# Patient Record
Sex: Female | Born: 2000 | Race: White | Hispanic: No | Smoking: Never smoker
Health system: Southern US, Community
[De-identification: ages and names within clinical notes are randomized; demographics above are authoritative.]

## PROBLEM LIST (undated history)

## (undated) DIAGNOSIS — F329 Major depressive disorder, single episode, unspecified: Secondary | ICD-10-CM

## (undated) DIAGNOSIS — F319 Bipolar disorder, unspecified: Secondary | ICD-10-CM

## (undated) DIAGNOSIS — J45909 Unspecified asthma, uncomplicated: Secondary | ICD-10-CM

## (undated) DIAGNOSIS — F32A Depression, unspecified: Secondary | ICD-10-CM

---
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
## 2018-01-07 ENCOUNTER — Encounter (HOSPITAL_COMMUNITY): Payer: Self-pay | Admitting: *Deleted

## 2018-01-07 ENCOUNTER — Other Ambulatory Visit: Payer: Self-pay

## 2018-01-07 ENCOUNTER — Inpatient Hospital Stay (HOSPITAL_COMMUNITY)
Admission: RE | Admit: 2018-01-07 | Discharge: 2018-01-10 | DRG: 885 | Disposition: A | Discharge status: Home / Self Care | Payer: Medicaid Other | Attending: Psychiatry | Admitting: Psychiatry

## 2018-01-07 DIAGNOSIS — F4310 Post-traumatic stress disorder, unspecified: Secondary | ICD-10-CM | POA: Diagnosis present

## 2018-01-07 DIAGNOSIS — F419 Anxiety disorder, unspecified: Secondary | ICD-10-CM | POA: Diagnosis not present

## 2018-01-07 DIAGNOSIS — Z818 Family history of other mental and behavioral disorders: Secondary | ICD-10-CM | POA: Diagnosis not present

## 2018-01-07 DIAGNOSIS — F909 Attention-deficit hyperactivity disorder, unspecified type: Secondary | ICD-10-CM | POA: Diagnosis present

## 2018-01-07 DIAGNOSIS — Z793 Long term (current) use of hormonal contraceptives: Secondary | ICD-10-CM

## 2018-01-07 DIAGNOSIS — Z62810 Personal history of physical and sexual abuse in childhood: Secondary | ICD-10-CM | POA: Diagnosis present

## 2018-01-07 DIAGNOSIS — F332 Major depressive disorder, recurrent severe without psychotic features: Secondary | ICD-10-CM | POA: Diagnosis present

## 2018-01-07 DIAGNOSIS — Z915 Personal history of self-harm: Secondary | ICD-10-CM

## 2018-01-07 DIAGNOSIS — N390 Urinary tract infection, site not specified: Secondary | ICD-10-CM | POA: Diagnosis present

## 2018-01-07 DIAGNOSIS — Z23 Encounter for immunization: Secondary | ICD-10-CM | POA: Diagnosis not present

## 2018-01-07 DIAGNOSIS — Z87440 Personal history of urinary (tract) infections: Secondary | ICD-10-CM

## 2018-01-07 DIAGNOSIS — J45909 Unspecified asthma, uncomplicated: Secondary | ICD-10-CM | POA: Diagnosis present

## 2018-01-07 DIAGNOSIS — R45851 Suicidal ideations: Secondary | ICD-10-CM | POA: Diagnosis present

## 2018-01-07 DIAGNOSIS — G4700 Insomnia, unspecified: Secondary | ICD-10-CM | POA: Diagnosis present

## 2018-01-07 DIAGNOSIS — Z79899 Other long term (current) drug therapy: Secondary | ICD-10-CM

## 2018-01-07 HISTORY — DX: Depression, unspecified: F32.A

## 2018-01-07 HISTORY — DX: Major depressive disorder, single episode, unspecified: F32.9

## 2018-01-07 HISTORY — DX: Unspecified asthma, uncomplicated: J45.909

## 2018-01-07 MED ORDER — HYDROXYZINE HCL 25 MG PO TABS
25.0000 mg | ORAL_TABLET | Freq: Three times a day (TID) | ORAL | Status: DC | PRN
  Administered 2018-01-08 – 2018-01-09 (×4): 25 mg via ORAL
  Filled 2018-01-07 (×4): qty 1

## 2018-01-07 MED ORDER — ACETAMINOPHEN 325 MG PO TABS
650.0000 mg | ORAL_TABLET | Freq: Four times a day (QID) | ORAL | Status: DC | PRN
  Administered 2018-01-08 – 2018-01-09 (×3): 650 mg via ORAL
  Filled 2018-01-07 (×3): qty 2

## 2018-01-07 MED ORDER — INFLUENZA VAC SPLIT QUAD 0.5 ML IM SUSY
0.5000 mL | PREFILLED_SYRINGE | INTRAMUSCULAR | Status: AC
  Administered 2018-01-08: 0.5 mL via INTRAMUSCULAR
  Filled 2018-01-07: qty 0.5

## 2018-01-07 MED ORDER — TRAZODONE HCL 50 MG PO TABS
50.0000 mg | ORAL_TABLET | Freq: Every evening | ORAL | Status: DC | PRN
  Administered 2018-01-07 – 2018-01-09 (×3): 50 mg via ORAL
  Filled 2018-01-07 (×3): qty 1

## 2018-01-07 MED ORDER — ALUM & MAG HYDROXIDE-SIMETH 200-200-20 MG/5ML PO SUSP: 30.0000 mL | ORAL | Status: DC | PRN

## 2018-01-07 MED ORDER — MAGNESIUM HYDROXIDE 400 MG/5ML PO SUSP: 30.0000 mL | Freq: Every day | ORAL | Status: DC | PRN

## 2018-01-07 NOTE — Tx Team (Signed)
Initial Treatment Plan 01/07/2018 10:02 PM [name] Handy GNF:[id number]    PATIENT STRESSORS: Marital or family conflict Medication change or noncompliance   PATIENT STRENGTHS: Ability for insight Average or above average intelligence Capable of independent living Communication skills General fund of knowledge Motivation for treatment/growth Physical Health Supportive family/friends   PATIENT IDENTIFIED PROBLEMS: Depression Suicidal thoughts "I want to not have the urge to end it all"                     DISCHARGE CRITERIA:  Ability to meet basic life and health needs Improved stabilization in mood, thinking, and/or behavior Reduction of life-threatening or endangering symptoms to within safe limits Verbal commitment to aftercare and medication compliance  PRELIMINARY DISCHARGE PLAN: Attend aftercare/continuing care group Return to previous living arrangement  PATIENT/FAMILY INVOLVEMENT: This treatment plan has been presented to and reviewed with the patient, Deborah Hubbard, and/or family member, .  The patient and family have been given the opportunity to ask questions and make suggestions.  [name], [place], [place] 01/07/2018, 10:02 PM

## 2018-01-07 NOTE — H&P (Signed)
Behavioral Health Medical Screening Exam  Deborah Hubbard Deborah Hubbard is an 17 y.o. female.  Total Time spent with patient: 20 minutes  Psychiatric Specialty Exam: Physical Exam  Constitutional: She is oriented to person, place, and time. She appears well-developed and well-nourished. No distress.  HENT:  Head: Normocephalic and atraumatic.  Right Ear: External ear normal.  Left Ear: External ear normal.  Eyes: Conjunctivae are normal. Right eye exhibits no discharge. Left eye exhibits no discharge.  Respiratory: Effort normal. No respiratory distress.  Musculoskeletal: Normal range of motion.  Neurological: She is alert and oriented to person, place, and time.  Skin: Skin is warm and dry. She is not diaphoretic.  Psychiatric: Her speech is normal. Her mood appears anxious. She is not withdrawn and not actively hallucinating. Thought content is not paranoid and not delusional. She expresses impulsivity and inappropriate judgment. She exhibits a depressed mood. She expresses suicidal ideation. She expresses no homicidal ideation. She expresses suicidal plans.    Review of Systems  Constitutional: Negative for chills, fever and weight loss.  Psychiatric/Behavioral: Positive for depression and suicidal ideas. Negative for hallucinations, memory loss and substance abuse. The patient is nervous/anxious and has insomnia.     There were no vitals taken for this visit.There is no height or weight on file to calculate BMI.  General Appearance: Casual and Fairly Groomed  Eye Contact:  Fair  Speech:  Clear and Coherent and Normal Rate  Volume:  Decreased  Mood:  Anxious, Depressed, Dysphoric, Hopeless and Worthless  Affect:  Congruent and Depressed  Thought Process:  Coherent, Goal Directed and Descriptions of Associations: Intact  Orientation:  Full (Time, Place, and Person)  Thought Content:  Logical and Hallucinations: None  Suicidal Thoughts:  Yes.  with intent/plan  Homicidal Thoughts:  No   Memory:  Immediate;   Fair Recent;   Fair  Judgement:  Impaired  Insight:  Fair  Psychomotor Activity:  Normal  Concentration: Concentration: Fair and Attention Span: Fair  Recall:  Good  Fund of Knowledge:Good  Language: Good  Akathisia:  No  Handed:  Right  AIMS (if indicated):     Assets:  Communication Skills Desire for Improvement Housing Intimacy Leisure Time Physical Health Transportation  Sleep:       Musculoskeletal: Strength & Muscle Tone: within normal limits Gait & Station: normal    Recommendations:  Based on my evaluation the patient does not appear to have an emergency medical condition.  Jackelyn Poling, NP 01/07/2018, 9:03 PM
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
## 2018-01-07 NOTE — Progress Notes (Signed)
Writer assume care for Pt. Pt denies SI/HI/AVH/Pain at this time. Snacks and fluids given. PRN trazodone requested and given. Will contiune with POC.

## 2018-01-07 NOTE — BH Assessment (Cosign Needed)
Disposition: Nira Conn, NP, patient meets inpatient criteria. Per Hassie Bruce, patient accepted to [facility]. [facility] Adult Bed 403-2.  Nira Conn - Accepting.  Dr. Jama Flavors - Attending

## 2018-01-08 DIAGNOSIS — F419 Anxiety disorder, unspecified: Secondary | ICD-10-CM

## 2018-01-08 DIAGNOSIS — F332 Major depressive disorder, recurrent severe without psychotic features: Principal | ICD-10-CM

## 2018-01-08 DIAGNOSIS — R45851 Suicidal ideations: Secondary | ICD-10-CM

## 2018-01-08 LAB — COMPREHENSIVE METABOLIC PANEL
ALK PHOS: 40 U/L (ref 38–126)
ALT: 19 U/L (ref 0–44)
ANION GAP: 8 (ref 5–15)
AST: 18 U/L (ref 15–41)
Albumin: 3.4 g/dL — ABNORMAL LOW (ref 3.5–5.0)
BILIRUBIN TOTAL: 0.4 mg/dL (ref 0.3–1.2)
BUN: 11 mg/dL (ref 6–20)
CO2: 23 mmol/L (ref 22–32)
Calcium: 8.9 mg/dL (ref 8.9–10.3)
Chloride: 110 mmol/L (ref 98–111)
Creatinine, Ser: 0.6 mg/dL (ref 0.44–1.00)
Glucose, Bld: 77 mg/dL (ref 70–99)
Potassium: 4 mmol/L (ref 3.5–5.1)
SODIUM: 141 mmol/L (ref 135–145)
TOTAL PROTEIN: 6.5 g/dL (ref 6.5–8.1)

## 2018-01-08 LAB — URINALYSIS, COMPLETE (UACMP) WITH MICROSCOPIC
Bilirubin Urine: NEGATIVE
GLUCOSE, UA: NEGATIVE mg/dL
Hgb urine dipstick: NEGATIVE
Ketones, ur: NEGATIVE mg/dL
NITRITE: NEGATIVE
PH: 6 (ref 5.0–8.0)
Protein, ur: 30 mg/dL — AB
SPECIFIC GRAVITY, URINE: 1.03 (ref 1.005–1.030)

## 2018-01-08 LAB — LIPID PANEL
Cholesterol: 129 mg/dL (ref 0–200)
HDL: 40 mg/dL — ABNORMAL LOW (ref >40)
LDL Cholesterol: 68 mg/dL (ref 0–99)
Total CHOL/HDL Ratio: 3.2 RATIO
Triglycerides: 103 mg/dL (ref <150)
VLDL: 21 mg/dL (ref 0–40)

## 2018-01-08 LAB — LITHIUM LEVEL: Lithium Lvl: <0.06 mmol/L — ABNORMAL LOW (ref 0.60–1.20)

## 2018-01-08 LAB — TSH: TSH: 2.12 u[IU]/mL (ref 0.350–4.500)

## 2018-01-08 LAB — RAPID URINE DRUG SCREEN, HOSP PERFORMED
AMPHETAMINES: NOT DETECTED
BARBITURATES: NOT DETECTED
Benzodiazepines: NOT DETECTED
Cocaine: NOT DETECTED
OPIATES: NOT DETECTED
TETRAHYDROCANNABINOL: NOT DETECTED

## 2018-01-08 LAB — PREGNANCY, URINE: Preg Test, Ur: NEGATIVE

## 2018-01-08 LAB — CBC
HCT: 40.8 % (ref 36.0–46.0)
Hemoglobin: 13.7 g/dL (ref 12.0–15.0)
MCH: 29.8 pg (ref 26.0–34.0)
MCHC: 33.6 g/dL (ref 30.0–36.0)
MCV: 88.9 fL (ref 80.0–100.0)
Platelets: 318 10*3/uL (ref 150–400)
RBC: 4.59 MIL/uL (ref 3.87–5.11)
RDW: 14.5 % (ref 11.5–15.5)
WBC: 10 10*3/uL (ref 4.0–10.5)
nRBC: 0 % (ref 0.0–0.2)

## 2018-01-08 LAB — HEMOGLOBIN A1C
HEMOGLOBIN A1C: 4.5 % — AB (ref 4.8–5.6)
Mean Plasma Glucose: 82.45 mg/dL

## 2018-01-08 MED ORDER — SERTRALINE HCL 50 MG PO TABS
50.0000 mg | ORAL_TABLET | Freq: Every day | ORAL | Status: DC
  Administered 2018-01-09 – 2018-01-10 (×2): 50 mg via ORAL
  Filled 2018-01-08 (×4): qty 1

## 2018-01-08 MED ORDER — NORELGESTROMIN-ETH ESTRADIOL 150-35 MCG/24HR TD PTWK
1.0000 | MEDICATED_PATCH | TRANSDERMAL | Status: DC
  Administered 2018-01-09: 1 via TRANSDERMAL
  Filled 2018-01-08: qty 1

## 2018-01-08 MED ORDER — ARIPIPRAZOLE 2 MG PO TABS
2.0000 mg | ORAL_TABLET | Freq: Every day | ORAL | Status: DC
  Administered 2018-01-09 – 2018-01-10 (×2): 2 mg via ORAL
  Filled 2018-01-08 (×4): qty 1

## 2018-01-08 NOTE — Progress Notes (Signed)
Pt was observed in the dayroom, seen interacting with peers. Pt attended wrap-up group. Pt appears animated/anxious in affect and mood. Pt denies SI/HI/AVH/Pain at this time. Pt appears to be somatically focused at times. PRN vistaril and trazodone requested and given. Pt was inform to remind family to bring birth control patch from home.Support and encouragement provided. Will continue with POC.

## 2018-01-08 NOTE — Progress Notes (Signed)
D:  Patient's self inventory sheet, patient has fair sleep, sleep medication helpful.  Fair appetite, low energy level, good concentration.  Rated depression 5, hopeless 3, anxiety 6.  Denied withdrawals.  Denied SI, then checked "almost all of the time".  Denied physical problems.  Denied physical pain.  Goal is hopefully work on meds, get in a better state of mind, be more open about how she feels.  Plans to talk to who she needs.  "Just in general, I apologize for asking too many questions."  No discharge plans. A:  Medications administered per MD orders.  Emotional support and encouragement given patient. R:  Denied SI and HI, contracts for safety.  Denied A/V hallucinations.  Safety maintained with 15 minute checks.
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
## 2018-01-08 NOTE — BHH Group Notes (Signed)
[facility] Mental Health Association Group Therapy 01/08/2018 1:15pm  Type of Therapy: Mental Health Association Presentation  Participation Level: Active  Participation Quality: Attentive  Affect: Appropriate  Cognitive: Oriented  Insight: Developing/Improving  Engagement in Therapy: Engaged  Modes of Intervention: Discussion, Education and Socialization  Summary of Progress/Problems: Mental Health Association (MHA) Speaker came to talk about his personal journey with mental health. The pt processed ways by which to relate to the speaker. MHA speaker provided handouts and educational information pertaining to groups and services offered by the [facility]. Pt was engaged in speaker's presentation and was receptive to resources provided.    Rona Ravens, LCSW 01/08/2018 11:02 AM

## 2018-01-08 NOTE — Therapy (Signed)
Occupational Therapy Group Note  Date:  01/08/2018 Time:  3:04 PM  Group Topic/Focus:  Self Esteem Action Plan:   The focus of this group is to help patients create a plan to continue to build self-esteem after discharge.  Participation Level:  Active  Participation Quality:  Appropriate  Affect:  Appropriate  Cognitive:  Appropriate  Insight: Improving  Engagement in Group:  Engaged  Modes of Intervention:  Activity, Discussion, Education and Socialization  Additional Comments:    S: "I just keep thinking negative but I am trying hard not to"  O: OT tx with focus on self esteem building this date. Education given on definition of self esteem, with both causes of low and high self esteem identified. Activity given for pt to identify a positive/aspiring trait for each letter of the alphabet. Pt to work with peers to help complete activity and build positive thinking.   A: Pt presents to group with appropriate affect, engaged and participatory throughout session. Pt contributed to brainstorming significantly with many examples. A-z activity completed with min-mod VC's. Pt appropriately brainstorming with other peers for support. Noted improved affect after completion of activity.   P: Education given on self esteem and how to improve this date. Handouts and activities given to help facilitate skills when reintegrating into community.   Dalphine Handing, MSOT, OTR/L Behavioral Health OT/ Acute Relief OT  Dalphine Handing 01/08/2018, 3:04 PM
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
## 2018-01-08 NOTE — Progress Notes (Signed)
Recreation Therapy Notes  Date: 10.16.19 Time: 0930 Location: [address] Dayroom  Group Topic: Stress Management  Goal Area(s) Addresses:  Patient will verbalize importance of using healthy stress management.  Patient will identify positive emotions associated with healthy stress management.   Intervention: Stress Management  Activity :  Meditation.  LRT introduced the stress management technique of meditation.  LRT played Hubbard meditation that dealt with resilience.  Patients were to listen and follow along as meditation played.  Education:  Stress Management, Discharge Planning.   Education Outcome: Acknowledges edcuation/In group clarification offered/Needs additional education  Clinical Observations/Feedback: Pt did not attend group.    Deborah Hubbard, LRT/CTRS         Deborah Hubbard 01/08/2018 11:32 AM 

## 2018-01-08 NOTE — Plan of Care (Signed)
Nurse discussed anxiety, depression, coping skills with patient. 

## 2018-01-08 NOTE — Tx Team (Addendum)
Interdisciplinary Treatment and Diagnostic Plan Update  01/09/2018 Time of Session: [place] MRN: [id number]  Principal Diagnosis: <principal problem not specified>  Secondary Diagnoses: Active Problems:   Severe recurrent major depression without psychotic features (HCC)   Current Medications:  Current Facility-Administered Medications  Medication Dose Route Frequency Provider Last Rate Last Dose  . acetaminophen (TYLENOL) tablet 650 mg  650 mg Oral Q6H PRN Lindon Romp A, NP   650 mg at 01/08/18 1445  . alum & mag hydroxide-simeth (MAALOX/MYLANTA) 200-200-20 MG/5ML suspension 30 mL  30 mL Oral Q4H PRN Lindon Romp A, NP      . ARIPiprazole (ABILIFY) tablet 2 mg  2 mg Oral Daily Cobos, Fernando A, MD      . hydrOXYzine (ATARAX/VISTARIL) tablet 25 mg  25 mg Oral TID PRN Rozetta Nunnery, NP   25 mg at 01/08/18 2110  . magnesium hydroxide (MILK OF MAGNESIA) suspension 30 mL  30 mL Oral Daily PRN Lindon Romp A, NP      . norelgestromin-ethinyl estradiol (ORTHO EVRA) 150-35 MCG/24HR transdermal patch 1 patch  1 patch Transdermal Weekly Hampton Abbot, MD      . sertraline (ZOLOFT) tablet 50 mg  50 mg Oral Daily Cobos, Fernando A, MD      . traZODone (DESYREL) tablet 50 mg  50 mg Oral QHS PRN Rozetta Nunnery, NP   50 mg at 01/08/18 2110   PTA Medications: Medications Prior to Admission  Medication Sig Dispense Refill Last Dose  . lithium carbonate 300 MG capsule Take 300 mg by mouth 2 (two) times daily with a meal.   Past Week at Unknown time  . norelgestromin-ethinyl estradiol (ORTHO EVRA) 150-35 MCG/24HR transdermal patch Place 1 patch onto the skin once a week.   01/01/2018 at Unknown time  . ondansetron (ZOFRAN-ODT) 4 MG disintegrating tablet Take 4 mg by mouth every 6 (six) hours as needed for nausea or vomiting.   Past Week at Unknown time    Patient Stressors: Marital or family conflict Medication change or noncompliance  Patient Strengths: Ability for  insight Average or above average intelligence Capable of independent living Communication skills General fund of knowledge Motivation for treatment/growth Physical Health Supportive family/friends  Treatment Modalities: Medication Management, Group therapy, Case management,  1 to 1 session with clinician, Psychoeducation, Recreational therapy.   Physician Treatment Plan for Primary Diagnosis: <principal problem not specified> Long Term Goal(s): Improvement in symptoms so as ready for discharge Improvement in symptoms so as ready for discharge   Short Term Goals: Ability to identify changes in lifestyle to reduce recurrence of condition will improve Ability to maintain clinical measurements within normal limits will improve Ability to identify changes in lifestyle to reduce recurrence of condition will improve Ability to maintain clinical measurements within normal limits will improve  Medication Management: Evaluate patient's response, side effects, and tolerance of medication regimen.  Therapeutic Interventions: 1 to 1 sessions, Unit Group sessions and Medication administration.  Evaluation of Outcomes: Not Met  Physician Treatment Plan for Secondary Diagnosis: Active Problems:   Severe recurrent major depression without psychotic features ([place])  Long Term Goal(s): Improvement in symptoms so as ready for discharge Improvement in symptoms so as ready for discharge   Short Term Goals: Ability to identify changes in lifestyle to reduce recurrence of condition will improve Ability to maintain clinical measurements within normal limits will improve Ability to identify changes in lifestyle to reduce recurrence of condition will improve Ability to maintain clinical measurements  within normal limits will improve     Medication Management: Evaluate patient's response, side effects, and tolerance of medication regimen.  Therapeutic Interventions: 1 to 1 sessions, Unit Group sessions  and Medication administration.  Evaluation of Outcomes: Not Met   RN Treatment Plan for Primary Diagnosis: <principal problem not specified> Long Term Goal(s): Knowledge of disease and therapeutic regimen to maintain health will improve  Short Term Goals: Ability to identify and develop effective coping behaviors will improve and Compliance with prescribed medications will improve  Medication Management: RN will administer medications as ordered by provider, will assess and evaluate patient's response and provide education to patient for prescribed medication. RN will report any adverse and/or side effects to prescribing provider.  Therapeutic Interventions: 1 on 1 counseling sessions, Psychoeducation, Medication administration, Evaluate responses to treatment, Monitor vital signs and CBGs as ordered, Perform/monitor CIWA, COWS, AIMS and Fall Risk screenings as ordered, Perform wound care treatments as ordered.  Evaluation of Outcomes: Not Met   LCSW Treatment Plan for Primary Diagnosis: <principal problem not specified> Long Term Goal(s): Safe transition to appropriate next level of care at discharge, Engage patient in therapeutic group addressing interpersonal concerns.  Short Term Goals: Engage patient in aftercare planning with referrals and resources, Increase social support and Increase skills for wellness and recovery  Therapeutic Interventions: Assess for all discharge needs, 1 to 1 time with Social worker, Explore available resources and support systems, Assess for adequacy in community support network, Educate family and significant other(s) on suicide prevention, Complete Psychosocial Assessment, Interpersonal group therapy.  Evaluation of Outcomes: Not Met   Progress in Treatment: Attending groups: Yes. Participating in groups: Yes. Taking medication as prescribed: Yes. Toleration medication: Yes. Family/Significant other contact made: No, will contact:  friend Patient  understands diagnosis: Yes. Discussing patient identified problems/goals with staff: Yes. Medical problems stabilized or resolved: Yes. Denies suicidal/homicidal ideation: Yes. Issues/concerns per patient self-inventory: No. Other: none  New problem(s) identified: No, Describe:  none  New Short Term/Long Term Goal(s):  Patient Goals:  "be on meds that can help me get into a positive mindset"  Discharge Plan or Barriers:   Reason for Continuation of Hospitalization: Depression Medication stabilization  Estimated Length of Stay: 3-5 days  Attendees: Patient:Deborah Hubbard 01/08/2018   Physician: Larene Beach, MD 01/08/2018   Nursing: Grayland Ormond, RN 01/08/2018   RN Care Manager: 01/08/2018   Social Worker: Lurline Idol, LCSW 01/08/2018   Recreational Therapist:  01/08/2018   Other:  01/08/2018   Other:  01/08/2018   Other: 01/08/2018        Scribe for Treatment Team: Joanne Chars, [place] 01/09/2018 8:05 AM

## 2018-01-09 LAB — PROLACTIN: PROLACTIN: 75.1 ng/mL — AB (ref 4.8–23.3)

## 2018-01-09 MED ORDER — NON FORMULARY: 1.0000 | Status: DC
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
## 2018-01-09 NOTE — Progress Notes (Signed)
Pt was observed in the dayroom, seen coloring.Pt attended wrap-up group and participated.Pt appears animated/anxious in affect and mood. Pt denies SI/HI/AVH/Pain at this time.Pt states she feels better and hopes to be discharge soon.Support and encouragement provided.PRN vistaril and trazodone requested and given. Will continue with POC.

## 2018-01-09 NOTE — Progress Notes (Signed)
Patient ID: Deborah Hubbard, female   DOB: 2001-02-05, 17 y.o.   MRN: [id number]  Nursing Progress Note [id number]  Data: Patient presents with euthymic mood and is pleasant with Clinical research associate. Patient compliant with scheduled medications. Patient denies pain/physical complaints. Patient completed self-inventory sheet and rates depression, hopelessness, and anxiety 0,0,1 respectively. Patient rates their sleep and appetite as fair/fair respectively. Patient states goal for today is to "keep a positive mind frame". Patient is seen attending groups and visible in the milieu. Patient currently denies SI/HI/AVH.   Action: Patient is educated about and provided medication per provider's orders. Patient safety maintained with q15 min safety checks and frequent rounding. Low fall risk precautions in place. Emotional support given. 1:1 interaction and active listening provided. Patient encouraged to attend meals, groups, and work on treatment plan and goals. Labs, vital signs and patient behavior monitored throughout shift.   Response: Patient remains safe on the unit at this time and agrees to come to staff with any issues/concerns. Patient is interacting with peers appropriately on the unit. Will continue to support and monitor.

## 2018-01-09 NOTE — [facility] Suicide Risk Assessment (Signed)
[facility] INPATIENT:  Family/Significant Other Suicide Prevention Education  Suicide Prevention Education:  Education Completed; [name], friend, [phone], has been identified by the patient as the family member/significant other with whom the patient will be residing, and identified as the person(s) who will aid the patient in the event of a mental health crisis (suicidal ideations/suicide attempt).  With written consent from the patient, the family member/significant other has been provided the following suicide prevention education, prior to the and/or following the discharge of the patient.  The suicide prevention education provided includes the following:  Suicide risk factors  Suicide prevention and interventions  National Suicide Hotline telephone number  [facility] assessment telephone number  [facility] Emergency Assistance 911  [facility] and/or Residential Mobile Crisis Unit telephone number  Request made of family/significant other to:  Remove weapons (e.g., guns, rifles, knives), all items previously/currently identified as safety concern.  Two rifles in the home, "no pins or ammunition".  CSW talked with her about locking up the guns.  Remove drugs/medications (over-the-counter, prescriptions, illicit drugs), all items previously/currently identified as a safety concern.  The family member/significant other verbalizes understanding of the suicide prevention education information provided.  The family member/significant other agrees to remove the items of safety concern listed above.  Lynden Ang lives next door to pt, "we check on her all the time."  Lynden Ang has been in touch with [facility] and pt also has another therapist: Jolene Provost who is going to meet with her outside of [facility].  Lynden Ang also requesting a letter for work prior to discharge so pt does not lose her job.  Lorri Frederick, LCSW 01/09/2018, 10:47 AM

## 2018-01-09 NOTE — [facility] Group Notes (Signed)
[facility] LCSW Group Therapy Note  Date/Time: 01/09/18, 1315  Type of Therapy/Topic:  Group Therapy:  Balance in Life  Participation Level:  active  Description of Group:    This group will address the concept of balance and how it feels and looks when one is unbalanced. Patients will be encouraged to process areas in their lives that are out of balance, and identify reasons for remaining unbalanced. Facilitators will guide patients utilizing problem- solving interventions to address and correct the stressor making their life unbalanced. Understanding and applying boundaries will be explored and addressed for obtaining  and maintaining a balanced life. Patients will be encouraged to explore ways to assertively make their unbalanced needs known to significant others in their lives, using other group members and facilitator for support and feedback.  Therapeutic Goals: 1. Patient will identify two or more emotions or situations they have that consume much of in their lives. 2. Patient will identify signs/triggers that life has become out of balance:  3. Patient will identify two ways to set boundaries in order to achieve balance in their lives:  4. Patient will demonstrate ability to communicate their needs through discussion and/or role plays  Summary of Patient Progress: Pt shared that issues with her son's father are consuming much of her life.  Pt very active during group discussion about setting boundaries to help keep life more in balance.           Therapeutic Modalities:   Cognitive Behavioral Therapy Solution-Focused Therapy Assertiveness Training  Daleen Squibb, [place]

## 2018-01-09 NOTE — Progress Notes (Addendum)
[facility] MD Progress Note  01/09/2018 2:55 PM Deborah Hubbard  MRN:  [id number]   Subjective: Patient states today that she is doing much much better.  She states that she never intended to hurt herself and she had the thought to cut her wrist but only superficial.  She stated that she went to go speak to someone so that she can get some help before things got worse and she was instructed to come to the hospital and then she got admitted.  Patient also states that she has a history of cutting, but that was approximately 5 years ago and this was not thinking about killing herself this was having the thought of just cutting as she used to.  Patient states that she never wants to hurt herself and wants to be there for her children and wants to go back home.  She states that she has a lot of support that surrounds her, which include family and friends that live in neighboring houses.  She still denies any suicidal or homicidal ideations.  She denies any hallucinations today.  She reports that she feels the medications are working well for her.  She states that she is sleeping well and has a good appetite.  She denies any medication side effects and is hoping to be discharged within the next day or so.  Objective: Patient's chart and findings reviewed and discussed with treatment team.  Patient presents in the day room attending group and she is pleasant, calm, and cooperative.  She has been seen interacting with peers and staff on the unit appropriately.  There have been no complaints about the patient at this time.  She has been compliant with treatment and medications which include attended her going to groups multiple times.  She plans to follow up with her outpatient provider for continued treatment.  Also did note reviewed patient's labs and her prolactin was elevated at 75.1 and after questioning patient due to her having a recent labor and may 2019 and the fact that she states that she is still producing  breast milk and does have some leakage we will continue to just monitor the prolactin will have another prolactin drawn in the morning.  Principal Problem: Severe recurrent major depression without psychotic features ([place]) Diagnosis:   Patient Active Problem List   Diagnosis Date Noted  . Severe recurrent major depression without psychotic features ([place]) [F33.2] 01/07/2018   Total Time spent with patient: 20 minutes  Past Psychiatric History: See H&P  Past Medical History:  Past Medical History:  Diagnosis Date  . Asthma   . Depression    History reviewed. No pertinent surgical history. Family History: History reviewed. No pertinent family history. Family Psychiatric  History: See H&P Social History:  Social History   Substance and Sexual Activity  Alcohol Use Never  . Frequency: Never     Social History   Substance and Sexual Activity  Drug Use Never    Social History   Socioeconomic History  . Marital status: Legally Separated    Spouse name: Not on file  . Number of children: Not on file  . Years of education: Not on file  . Highest education level: Not on file  Occupational History  . Not on file  Social Needs  . Financial resource strain: Not on file  . Food insecurity:    Worry: Not on file    Inability: Not on file  . Transportation needs:    Medical: Not on  file    Non-medical: Not on file  Tobacco Use  . Smoking status: Never Smoker  . Smokeless tobacco: Never Used  Substance and Sexual Activity  . Alcohol use: Never    Frequency: Never  . Drug use: Never  . Sexual activity: Yes    Birth control/protection: Patch  Lifestyle  . Physical activity:    Days per week: Not on file    Minutes per session: Not on file  . Stress: Not on file  Relationships  . Social connections:    Talks on phone: Not on file    Gets together: Not on file    Attends religious service: Not on file    Active member of club or organization: Not on file    Attends  meetings of clubs or organizations: Not on file    Relationship status: Not on file  Other Topics Concern  . Not on file  Social History Narrative  . Not on file   Additional Social History:                         Sleep: Good  Appetite:  Good  Current Medications: Current Facility-Administered Medications  Medication Dose Route Frequency Provider Last Rate Last Dose  . acetaminophen (TYLENOL) tablet 650 mg  650 mg Oral Q6H PRN Lindon Romp A, NP   650 mg at 01/08/18 1445  . alum & mag hydroxide-simeth (MAALOX/MYLANTA) 200-200-20 MG/5ML suspension 30 mL  30 mL Oral Q4H PRN Lindon Romp A, NP      . ARIPiprazole (ABILIFY) tablet 2 mg  2 mg Oral Daily Cobos, Myer Peer, MD   2 mg at 01/09/18 5170  . hydrOXYzine (ATARAX/VISTARIL) tablet 25 mg  25 mg Oral TID PRN Rozetta Nunnery, NP   25 mg at 01/08/18 2110  . magnesium hydroxide (MILK OF MAGNESIA) suspension 30 mL  30 mL Oral Daily PRN Lindon Romp A, NP      . norelgestromin-ethinyl estradiol (ORTHO EVRA) 150-35 MCG/24HR transdermal patch 1 patch  1 patch Transdermal Weekly Hampton Abbot, MD      . sertraline (ZOLOFT) tablet 50 mg  50 mg Oral Daily Cobos, Myer Peer, MD   50 mg at 01/09/18 0829  . traZODone (DESYREL) tablet 50 mg  50 mg Oral QHS PRN Rozetta Nunnery, NP   50 mg at 01/08/18 2110    Lab Results:  Results for orders placed or performed during the hospital encounter of 01/07/18 (from the past 48 hour(s))  CBC     Status: None   Collection Time: 01/08/18  6:22 AM  Result Value Ref Range   WBC 10.0 4.0 - 10.5 K/uL   RBC 4.59 3.87 - 5.11 MIL/uL   Hemoglobin 13.7 12.0 - 15.0 g/dL   HCT 40.8 36.0 - 46.0 %   MCV 88.9 80.0 - 100.0 fL   MCH 29.8 26.0 - 34.0 pg   MCHC 33.6 30.0 - 36.0 g/dL   RDW 14.5 11.5 - 15.5 %   Platelets 318 150 - 400 K/uL   nRBC 0.0 0.0 - 0.2 %    Comment: Performed at [facility], [place] [address], [place],  [address]  Comprehensive metabolic panel     Status:  Abnormal   Collection Time: 01/08/18  6:22 AM  Result Value Ref Range   Sodium 141 135 - 145 mmol/L   Potassium 4.0 3.5 - 5.1 mmol/L   Chloride 110 98 - 111 mmol/L  CO2 23 22 - 32 mmol/L   Glucose, Bld 77 70 - 99 mg/dL   BUN 11 6 - 20 mg/dL   Creatinine, Ser 0.60 0.44 - 1.00 mg/dL   Calcium 8.9 8.9 - 10.3 mg/dL   Total Protein 6.5 6.5 - 8.1 g/dL   Albumin 3.4 (L) 3.5 - 5.0 g/dL   AST 18 15 - 41 U/L   ALT 19 0 - 44 U/L   Alkaline Phosphatase 40 38 - 126 U/L   Total Bilirubin 0.4 0.3 - 1.2 mg/dL   GFR calc non Af Amer >60 >60 mL/min   GFR calc Af Amer >60 >60 mL/min    Comment: (NOTE) The eGFR has been calculated using the CKD EPI equation. This calculation has not been validated in all clinical situations. eGFR's persistently <60 mL/min signify possible Chronic Kidney Disease.    Anion gap 8 5 - 15    Comment: Performed at [facility], [place] [address], [place], [place] [address]  Hemoglobin A1c     Status: Abnormal   Collection Time: 01/08/18  6:22 AM  Result Value Ref Range   Hgb A1c MFr Bld 4.5 (L) 4.8 - 5.6 %    Comment: (NOTE) Pre diabetes:          5.7%-6.4% Diabetes:              >6.4% Glycemic control for   <7.0% adults with diabetes    Mean Plasma Glucose 82.45 mg/dL    Comment: Performed at [place] [address], [place], [place] [address]  Lipid panel     Status: Abnormal   Collection Time: 01/08/18  6:22 AM  Result Value Ref Range   Cholesterol 129 0 - 200 mg/dL   Triglycerides 103 <150 mg/dL   HDL 40 (L) >40 mg/dL   Total CHOL/HDL Ratio 3.2 RATIO   VLDL 21 0 - 40 mg/dL   LDL Cholesterol 68 0 - 99 mg/dL    Comment:        Total Cholesterol/HDL:CHD Risk Coronary Heart Disease Risk Table                     Men   Women  1/2 Average Risk   3.4   3.3  Average Risk       5.0   4.4  2 X Average Risk   9.6   7.1  3 X Average Risk  23.4   11.0        Use the calculated Patient Ratio above and the CHD Risk Table to determine  the patient's CHD Risk.        ATP III CLASSIFICATION (LDL):  <100     mg/dL   Optimal  100-129  mg/dL   Near or Above                    Optimal  130-159  mg/dL   Borderline  160-189  mg/dL   High  >190     mg/dL   Very High Performed at [facility] [address], [place], [place] [address]   TSH     Status: None   Collection Time: 01/08/18  6:22 AM  Result Value Ref Range   TSH 2.120 0.350 - 4.500 uIU/mL    Comment: Performed by a 3rd Generation assay with a functional sensitivity of <=0.01 uIU/mL. Performed at [facility], [place] [address], [place], [place] [address]  Prolactin     Status: Abnormal   Collection Time: 01/08/18  6:22 AM  Result Value Ref Range   Prolactin 75.1 (H) 4.8 - 23.3 ng/mL    Comment: (NOTE) Performed At: [facility] [place], [place] [id number] Rush Farmer MD UX:[id number]   Lithium level     Status: Abnormal   Collection Time: 01/08/18  6:22 AM  Result Value Ref Range   Lithium Lvl <0.06 (L) 0.60 - 1.20 mmol/L    Comment: REPEATED TO VERIFY Performed at [facility], [place] [address], [place], [place] [address]   Urinalysis, Complete w Microscopic     Status: Abnormal   Collection Time: 01/08/18  6:15 PM  Result Value Ref Range   Color, Urine YELLOW YELLOW   APPearance CLOUDY (A) CLEAR   Specific Gravity, Urine 1.030 1.005 - 1.030   pH 6.0 5.0 - 8.0   Glucose, UA NEGATIVE NEGATIVE mg/dL   Hgb urine dipstick NEGATIVE NEGATIVE   Bilirubin Urine NEGATIVE NEGATIVE   Ketones, ur NEGATIVE NEGATIVE mg/dL   Protein, ur 30 (A) NEGATIVE mg/dL   Nitrite NEGATIVE NEGATIVE   Leukocytes, UA MODERATE (A) NEGATIVE   RBC / HPF 0-5 0 - 5 RBC/hpf   WBC, UA 6-10 0 - 5 WBC/hpf   Bacteria, UA RARE (A) NONE SEEN   Squamous Epithelial / LPF 21-50 0 - 5   Mucus PRESENT     Comment: Performed at [facility], [place] [address], [place], [place] [address]   Pregnancy, urine     Status: None   Collection Time: 01/08/18  6:15 PM  Result Value Ref Range   Preg Test, Ur NEGATIVE NEGATIVE    Comment:        THE SENSITIVITY OF THIS METHODOLOGY IS >20 mIU/mL. Performed at [facility] [place] [address], [place], [place] [address]   Rapid urine drug screen (hospital performed)     Status: None   Collection Time: 01/08/18  6:15 PM  Result Value Ref Range   Opiates NONE DETECTED NONE DETECTED   Cocaine NONE DETECTED NONE DETECTED   Benzodiazepines NONE DETECTED NONE DETECTED   Amphetamines NONE DETECTED NONE DETECTED   Tetrahydrocannabinol NONE DETECTED NONE DETECTED   Barbiturates NONE DETECTED NONE DETECTED    Comment: (NOTE) DRUG SCREEN FOR MEDICAL PURPOSES ONLY.  IF CONFIRMATION IS NEEDED FOR ANY PURPOSE, NOTIFY LAB WITHIN 5 DAYS. LOWEST DETECTABLE LIMITS FOR URINE DRUG SCREEN Drug Class                     Cutoff (ng/mL) Amphetamine and metabolites    1000 Barbiturate and metabolites    200 Benzodiazepine                 638 Tricyclics and metabolites     300 Opiates and metabolites        300 Cocaine and metabolites        300 THC                            50 Performed at [facility], [place] [address] [place], [place] [address]     Blood Alcohol level:  No results found for: [facility]  Metabolic Disorder Labs: Lab Results  Component Value Date   HGBA1C 4.5 (L) 01/08/2018   MPG 82.45 01/08/2018   Lab Results  Component Value Date   PROLACTIN 75.1 (H) 01/08/2018  Lab Results  Component Value Date   CHOL 129 01/08/2018   TRIG 103 01/08/2018   HDL 40 (L) 01/08/2018   CHOLHDL 3.2 01/08/2018   VLDL 21 01/08/2018   LDLCALC 68 01/08/2018    Physical Findings: AIMS: Facial and Oral Movements Muscles of Facial Expression: None, normal Lips and Perioral Area: None, normal Jaw: None, normal Tongue: None, normal,Extremity Movements Upper (arms, wrists, hands, fingers): None,  normal Lower (legs, knees, ankles, toes): None, normal, Trunk Movements Neck, shoulders, hips: None, normal, Overall Severity Severity of abnormal movements (highest score from questions above): None, normal Incapacitation due to abnormal movements: None, normal Patient's awareness of abnormal movements (rate only patient's report): No Awareness, Dental Status Current problems with teeth and/or dentures?: No Does patient usually wear dentures?: No  CIWA:  CIWA-Ar Total: 1 COWS:  COWS Total Score: 2  Musculoskeletal: Strength & Muscle Tone: within normal limits Gait & Station: normal Patient leans: N/A  Psychiatric Specialty Exam: Physical Exam  ROS  Blood pressure 113/80, pulse (!) 115, temperature 98.7 F (37.1 C), temperature source Oral, resp. rate 20, height 4' 10"  (1.473 m), weight 101.6 kg.Body mass index is 46.82 kg/m.  General Appearance: Casual  Eye Contact:  Good  Speech:  Clear and Coherent and Normal Rate  Volume:  Normal  Mood:  Euthymic  Affect:  Congruent  Thought Process:  Goal Directed and Descriptions of Associations: Intact  Orientation:  Full (Time, Place, and Person)  Thought Content:  WDL  Suicidal Thoughts:  No  Homicidal Thoughts:  No  Memory:  Immediate;   Good Recent;   Good Remote;   Good  Judgement:  Fair  Insight:  Fair  Psychomotor Activity:  Normal  Concentration:  Concentration: Good and Attention Span: Good  Recall:  Good  Fund of Knowledge:  Good  Language:  Good  Akathisia:  No  Handed:  Right  AIMS (if indicated):     Assets:  Communication Skills Desire for Improvement Financial Resources/Insurance Housing Physical Health Social Support Transportation  ADL's:  Intact  Cognition:  WNL  Sleep:  Number of Hours: 6.75   Problems addressed MDD severe recurrent  Treatment Plan Summary: Daily contact with patient to assess and evaluate symptoms and progress in treatment, Medication management and Plan is to: Continue Abilify  2 mg p.o. daily for mood stability Continue Zoloft 50 mg p.o. daily for mood stability Continue trazodone 50 mg p.o. nightly as needed for mood stability Encourage group therapy participation Draw a prolactin level Potential discharge tomorrow  Lewis Shock, FNP 01/09/2018, 2:55 PM    ..Agree with NP Progress Note

## 2018-01-10 MED ORDER — TRAZODONE HCL 50 MG PO TABS: 50.0000 mg | ORAL_TABLET | Freq: Every evening | ORAL | 0 refills | Status: DC | PRN

## 2018-01-10 MED ORDER — CIPROFLOXACIN HCL 500 MG PO TABS: 500.0000 mg | ORAL_TABLET | Freq: Two times a day (BID) | ORAL | 0 refills | Status: DC

## 2018-01-10 MED ORDER — ARIPIPRAZOLE 2 MG PO TABS: 2.0000 mg | ORAL_TABLET | Freq: Every day | ORAL | 0 refills | Status: AC

## 2018-01-10 MED ORDER — HYDROXYZINE HCL 25 MG PO TABS: 25.0000 mg | ORAL_TABLET | Freq: Three times a day (TID) | ORAL | 0 refills | Status: DC | PRN

## 2018-01-10 MED ORDER — SERTRALINE HCL 50 MG PO TABS: 50.0000 mg | ORAL_TABLET | Freq: Every day | ORAL | 0 refills | Status: DC

## 2018-01-10 MED ORDER — NORELGESTROMIN-ETH ESTRADIOL 150-35 MCG/24HR TD PTWK: 1.0000 | MEDICATED_PATCH | TRANSDERMAL | 12 refills | Status: DC

## 2018-01-10 MED ORDER — CIPROFLOXACIN HCL 500 MG PO TABS
500.0000 mg | ORAL_TABLET | Freq: Two times a day (BID) | ORAL | Status: DC
  Administered 2018-01-10: 500 mg via ORAL
  Filled 2018-01-10 (×5): qty 1

## 2018-01-10 NOTE — BHH Suicide Risk Assessment (Signed)
[facility] Discharge Suicide Risk Assessment   Principal Problem: Severe recurrent major depression without psychotic features [facility]) Discharge Diagnoses:  Patient Active Problem List   Diagnosis Date Noted  . Severe recurrent major depression without psychotic features ([facility]) [F33.2] 01/07/2018    Total Time spent with patient: 30 minutes  Musculoskeletal: Strength & Muscle Tone: within normal limits Gait & Station: normal Patient leans: N/A  Psychiatric Specialty Exam: ROS no headache, no chest pain, no chest pain, no shortness of breath, no vomiting , reports mild dysuria, no fever or chills  Blood pressure 140/88, pulse (!) 101, temperature 97.8 F (36.6 C), temperature source Oral, resp. rate 20, height 4\' 10"  (1.473 m), weight 101.6 kg.Body mass index is 46.82 kg/m.  General Appearance: Well Groomed  Eye Contact::  Good  Speech:  Normal Rate409  Volume:  Normal  Mood:  presents euthymic, states " I am feeling a lot better"  Affect:  Appropriate and more reactive   Thought Process:  Linear and Descriptions of Associations: Intact  Orientation:  Full (Time, Place, and Person)  Thought Content:  no hallucinations, no delusions, not internally preoccupied   Suicidal Thoughts:  No- denies suicidal or self injurious ideations  Homicidal Thoughts:  No- denies homicidal or violent ideations  Memory:  recent and remote grossly intact   Judgement:  Other:  improving   Insight:  improving   Psychomotor Activity:  Normal  Concentration:  Good  Recall:  Good  Fund of Knowledge:Good  Language: Good  Akathisia:  Negative  Handed:  Right  AIMS (if indicated):     Assets:  Communication Skills Resilience  Sleep:  Number of Hours: 6.75  Cognition: WNL  ADL's:  Intact   Mental Status Per Nursing Assessment::   On Admission:  Self-harm thoughts  Demographic Factors:  17, separated, has 31 month old child, employed, lives with a friend   Loss Factors: Separation, family  stressors  Historical Factors: History of one prior psychiatric admission in 2016 for suicidal ideations, reports prior diagnosis of Bipolar Disorder and PTSD diagnosis. Does not report clear history of hypomania or mania, but describes mood instability, with brief short lived mood swings lasting minutes or short periods of time.  Risk Reduction Factors:   Responsible for children under 14 years of age, Sense of responsibility to family, Employed, Living with another person, especially a relative and Positive coping skills or problem solving skills  Continued Clinical Symptoms:  At this time patient is alert, attentive, well groomed, mood improved and currently euthymic, affect appropriate, full in range, no thought disorder, no suicidal or self injurious ideations, no homicidal or violent ideations. Future oriented . States she is sleeping well . Denies medication side effects. We reviewed medication side effects, including potential risk of increased suicidal ideations early in treatment with antidepressants in young adults . Behavior on unit in good control, interactive with peers, pleasant on approach. Of note, describes mild dysuria, and UA positive for leukocytes, negative nitrates . Patient reports history of several UTIs in the past .  Discussed case with Hospitalist- patient has history of prior treatment with Cipro, no side effects, good response. Will start Cipro course for UTI.  Cognitive Features That Contribute To Risk:  No gross cognitive deficits noted upon discharge. Is alert , attentive, and oriented x 3    Suicide Risk:  Mild:  Suicidal ideation of limited frequency, intensity, duration, and specificity.  There are no identifiable plans, no associated intent, mild dysphoria and related symptoms, good  self-control (both objective and subjective assessment), few other risk factors, and identifiable protective factors, including available and accessible social support.  Follow-up  Information    [facility] [employer]. Go on 01/13/2018.   Why:  Please attend your appt on Monday, 01/13/18, at 12:15pm.  Please bring photo ID, social security card, and medicaid card. Contact information: [address] [place] [address] [phone]        Remer Macho Follow up.   Why:  Your Child psychotherapist will call you when Mr. Lewis returns our call to schedule your next therapy appt. Contact information: [address]  [place], [place], [address] P: [phone]          Plan Of Care/Follow-up recommendations:  Activity:  as tolerated  Diet:  regular Tests:  NA Other:  See below  Patient is expressing readiness for discharge- no current grounds for involuntary commitment . Plans to return home. States her friend will be picking her up. Follow up as above . She also has a PCP whom she plans to follow up with for medical issues as needed .  Craige Cotta, MD 01/10/2018, 9:53 AM

## 2018-01-10 NOTE — BHH Group Notes (Signed)
  [facility] LCSW Group Therapy Note  Date/Time: 01/10/18, 1315  Type of Therapy/Topic:  Group Therapy:  Emotion Regulation  Participation Level:  Active   Mood:pleasant  Description of Group:    The purpose of this group is to assist patients in learning to regulate negative emotions and experience positive emotions. Patients will be guided to discuss ways in which they have been vulnerable to their negative emotions. These vulnerabilities will be juxtaposed with experiences of positive emotions or situations, and patients challenged to use positive emotions to combat negative ones. Special emphasis will be placed on coping with negative emotions in conflict situations, and patients will process healthy conflict resolution skills.  Therapeutic Goals: 1. Patient will identify two positive emotions or experiences to reflect on in order to balance out negative emotions:  2. Patient will label two or more emotions that they find the most difficult to experience:  3. Patient will be able to demonstrate positive conflict resolution skills through discussion or role plays:   Summary of Patient Progress:Pt shared that depression and rage are difficult emotions to experience.  Pt attentive during group discussion and made a number of comments.         Therapeutic Modalities:   Cognitive Behavioral Therapy Feelings Identification Dialectical Behavioral Therapy  Daleen Squibb, LCSW

## 2018-01-10 NOTE — Discharge Summary (Addendum)
Physician Discharge Summary Note  Patient:  Deborah Hubbard is an 17 y.o., female  MRN:  [id number]  DOB:  2001-03-22  Patient phone:  [phone] (home)   Patient address:   [address] [place] [address],   Total Time spent with patient: Greater than 30 minutes  Date of Admission:  01/07/2018  Date of Discharge: 01-10-18  Reason for Admission: Thoughts of self harm/thinking of cutting herself with a knife   Principal Problem: Severe recurrent major depression without psychotic features [facility])  Discharge Diagnoses: Patient Active Problem List   Diagnosis Date Noted  . Severe recurrent major depression without psychotic features (HCC) [F33.2] 01/07/2018   Past Psychiatric History: See H&P  Past Medical History:  Past Medical History:  Diagnosis Date  . Asthma   . Depression    History reviewed. No pertinent surgical history.  Family History: History reviewed. No pertinent family history.  Family Psychiatric  History: See H&P  Social History:  Social History   Substance and Sexual Activity  Alcohol Use Never  . Frequency: Never     Social History   Substance and Sexual Activity  Drug Use Never    Social History   Socioeconomic History  . Marital status: Legally Separated    Spouse name: Not on file  . Number of children: Not on file  . Years of education: Not on file  . Highest education level: Not on file  Occupational History  . Not on file  Social Needs  . Financial resource strain: Not on file  . Food insecurity:    Worry: Not on file    Inability: Not on file  . Transportation needs:    Medical: Not on file    Non-medical: Not on file  Tobacco Use  . Smoking status: Never Smoker  . Smokeless tobacco: Never Used  Substance and Sexual Activity  . Alcohol use: Never    Frequency: Never  . Drug use: Never  . Sexual activity: Yes    Birth control/protection: Patch  Lifestyle  . Physical activity:    Days per week: Not on file     Minutes per session: Not on file  . Stress: Not on file  Relationships  . Social connections:    Talks on phone: Not on file    Gets together: Not on file    Attends religious service: Not on file    Active member of club or organization: Not on file    Attends meetings of clubs or organizations: Not on file    Relationship status: Not on file  Other Topics Concern  . Not on file  Social History Narrative  . Not on file   Hospital Course: (Per Md's admission note): 17 year old female , presented to hospital for thoughts of self harm/thinking of cutting herself with a knife . Reports these thoughts were " only that day", and had not felt suicidal recently. She does acknowledge she has been feeling depressed over the last two weeks. Endorses some neuro-vegetative symptoms of depression as below. Denies psychotic symptoms. Reports stressors include family tension/frequent arguments with mother, being in the process of separation , having a young child ( she is five month post partum).  After the above admission evaluation, [name] was started on the medication regimen for her presenting symptoms. She was medicated & discharged on Abilify 2 mg for mood control /adjunct to Sertraline, Sertraline 50 mg for depression, Vistaril 25 mg prn for anxiety & Trazodone 50 mg  prn for insomnia. She was enrolled & participated in the group counseling sessions being offered & held on this unit. She learned coping skills. She received & was discharged on other medication regimen for the other medication issues presented. She tolerated her treatment regimen without any adverse effects or reactions reported. Hadyn's symptoms responded well to her treatment regimen. She is now mentally & medically stable for discharge to continue mental health care on an outpatient basis.  Upon discharge, patient is alert, attentive, well groomed, mood improved and currently euthymic, affect appropriate, full in range, no thought  disorder, no suicidal or self injurious ideations, no homicidal or violent ideations. Future oriented . States she is sleeping well. Denies medication side effects. We reviewed medication side effects, including potential risk of increased suicidal ideations early in treatment with antidepressants in young adults. Behavior on unit in good control, interactive with peers, pleasant on approach. Of note, describes mild dysuria, and UA positive for leukocytes, negative nitrates . Patient reports history of several UTIs in the past. Discussed case with Hospitalist- patient has history of prior treatment with Cipro, no side effects, good response. Will start Cipro course for UTI.   Physical Findings: AIMS: Facial and Oral Movements Muscles of Facial Expression: None, normal Lips and Perioral Area: None, normal Jaw: None, normal Tongue: None, normal,Extremity Movements Upper (arms, wrists, hands, fingers): None, normal Lower (legs, knees, ankles, toes): None, normal, Trunk Movements Neck, shoulders, hips: None, normal, Overall Severity Severity of abnormal movements (highest score from questions above): None, normal Incapacitation due to abnormal movements: None, normal Patient's awareness of abnormal movements (rate only patient's report): No Awareness, Dental Status Current problems with teeth and/or dentures?: No Does patient usually wear dentures?: No  CIWA:  CIWA-Ar Total: 1 COWS:  COWS Total Score: 2  Musculoskeletal: Strength & Muscle Tone: within normal limits Gait & Station: normal Patient leans: N/A  Psychiatric Specialty Exam: Physical Exam  Nursing note and vitals reviewed. Constitutional: She appears well-developed.  HENT:  Head: Normocephalic.  Eyes: Pupils are equal, round, and reactive to light.  Neck: Normal range of motion.  Cardiovascular: Normal rate.  Respiratory: Effort normal.  GI: Soft.  Genitourinary:  Genitourinary Comments: Deferred  Musculoskeletal: Normal  range of motion.  Neurological: She is alert.  Skin: Skin is warm.    Review of Systems  Constitutional: Negative.   HENT: Negative.   Eyes: Negative.   Respiratory: Negative.  Negative for cough and shortness of breath.   Cardiovascular: Negative.  Negative for chest pain and palpitations.  Gastrointestinal: Negative.   Genitourinary: Negative.   Musculoskeletal: Negative.   Skin: Negative.   Neurological: Negative.   Endo/Heme/Allergies: Negative.   Psychiatric/Behavioral: Positive for depression (Stable). Negative for memory loss, substance abuse and suicidal ideas. The patient has insomnia (Stable). The patient is not nervous/anxious.     Blood pressure 140/88, pulse (!) 101, temperature 97.8 F (36.6 C), temperature source Oral, resp. rate 20, height 4\' 10"  (1.473 m), weight 101.6 kg.Body mass index is 46.82 kg/m.  See Md's SRA   Have you used any form of tobacco in the last 30 days? (Cigarettes, Smokeless Tobacco, Cigars, and/or Pipes): No  Has this patient used any form of tobacco in the last 30 days? (Cigarettes, Smokeless Tobacco, Cigars, and/or Pipes): N/A  Blood Alcohol level:  No results found for: [facility]  Metabolic Disorder Labs:  Lab Results  Component Value Date   HGBA1C 4.5 (L) 01/08/2018   MPG 82.45 01/08/2018   Lab Results  Component Value Date   PROLACTIN 75.1 (H) 01/08/2018   Lab Results  Component Value Date   CHOL 129 01/08/2018   TRIG 103 01/08/2018   HDL 40 (L) 01/08/2018   CHOLHDL 3.2 01/08/2018   VLDL 21 01/08/2018   LDLCALC 68 01/08/2018   See Psychiatric Specialty Exam and Suicide Risk Assessment completed by Attending Physician prior to discharge.  Discharge destination:  Home  Is patient on multiple antipsychotic therapies at discharge:  No   Has Patient had three or more failed trials of antipsychotic monotherapy by history:  No  Recommended Plan for Multiple Antipsychotic Therapies: NA  Allergies as of 01/10/2018   No Known  Allergies     Medication List    STOP taking these medications   lithium carbonate 300 MG capsule   ondansetron 4 MG disintegrating tablet Commonly known as:  ZOFRAN-ODT     TAKE these medications     Indication  ARIPiprazole 2 MG tablet Commonly known as:  ABILIFY Take 1 tablet (2 mg total) by mouth daily. For mood control Start taking on:  01/11/2018  Indication:  Mood control   ciprofloxacin 500 MG tablet Commonly known as:  CIPRO Take 1 tablet (500 mg total) by mouth 2 (two) times daily. X 7 days: For urinary tract infection  Indication:  UTI   hydrOXYzine 25 MG tablet Commonly known as:  ATARAX/VISTARIL Take 1 tablet (25 mg total) by mouth 3 (three) times daily as needed for anxiety.  Indication:  Feeling Anxious   norelgestromin-ethinyl estradiol 150-35 MCG/24HR transdermal patch Commonly known as:  ORTHO EVRA Place 1 patch onto the skin once a week. For pregnancy prevention. Start taking on:  01/15/2018 What changed:  additional instructions  Indication:  Birth Control Treatment   sertraline 50 MG tablet Commonly known as:  ZOLOFT Take 1 tablet (50 mg total) by mouth daily. For depression Start taking on:  01/11/2018  Indication:  Major Depressive Disorder   traZODone 50 MG tablet Commonly known as:  DESYREL Take 1 tablet (50 mg total) by mouth at bedtime as needed for sleep.  Indication:  Trouble Sleeping      Follow-up [employer], [employer]. Go on 01/13/2018.   Why:  Please attend your appt on Monday, 01/13/18, at 12:15pm.  Please bring photo ID, social security card, and medicaid card. Contact information: [address] [place] [address] [phone]        Remer Macho Follow up.   Why:  Your Child psychotherapist will call you when Mr. Lewis returns our call to schedule your next therapy appt. Contact information: [address]  [place], [place], [address] P: [phone]         Follow-up recommendations:  Activity:  As tolerated Diet: As recommended by your primary care doctor. Keep all scheduled follow-up appointments as recommended.    Comments: Patient is instructed prior to discharge to: Take all medications as prescribed by his/her mental healthcare provider. Report any adverse effects and or reactions from the medicines to his/her outpatient provider promptly. Patient has been instructed & cautioned: To not engage in alcohol and or illegal drug use while on prescription medicines. In the event of worsening symptoms, patient is instructed to call the crisis hotline, 911 and or go to the nearest ED for appropriate evaluation and treatment of symptoms. To follow-up with his/her primary care provider for your other medical issues, concerns and or health care needs.   Signed: Armandina Stammer, NP, PMHNP,  FNP-BC 01/10/2018, 11:29 AM   Patient seen, Suicide Assessment Completed.  Disposition Plan Reviewed

## 2018-01-10 NOTE — Progress Notes (Signed)
CSW left second message with therapist Remer Macho requesting appt time. Garner Nash, MSW, LCSW Clinical Social Worker 01/10/2018 9:19 AM

## 2018-01-10 NOTE — Progress Notes (Signed)
Recreation Therapy Notes  Date: 10.18.19 Time: 0930 Location: [address] Dayroom  Group Topic: Stress Management  Goal Area(s) Addresses:  Patient will verbalize importance of using healthy stress management.  Patient will identify positive emotions associated with healthy stress management.   Intervention: Stress Management  Activity : Progressive Muscle Relaxation.  LRT introduced the stress management technique of progressive muscle relaxation.  LRT read a script to guide patients in tensing each muscle group individually then relaxing them. Patients were to follow along as the script was read.  Education:  Stress Management, Discharge Planning.   Education Outcome: Acknowledges edcuation/In group clarification offered/Needs additional education  Clinical Observations/Feedback: Pt did not attend group.    Adan Beal, LRT/CTRS         Lianni Kanaan A 01/10/2018 11:17 AM 

## 2018-01-10 NOTE — Progress Notes (Signed)
Patient ID: Deborah Hubbard, female   DOB: [birth year], 17 y.o.   MRN: [id number]  Nursing Progress Note [id number]  Data: Patient presents with euphoric mood. Patient has animated affect and high energy. Patient compliant with scheduled medications. Patient denies pain/physical complaints. Patient completed self-inventory sheet and rates depression, hopelessness, and anxiety 0,0,1 respectively. Patient rates their sleep and appetite as good/good respectively. Patient states goal for today is to "keeping a positive attitude and ignoring any negativity". Patient is seen attending groups and visible in the milieu. Patient currently denies SI/HI/AVH.   Action: Patient is educated about and provided medication per provider's orders. Patient safety maintained with q15 min safety checks and frequent rounding. Low fall risk precautions in place. Emotional support given. 1:1 interaction and active listening provided. Patient encouraged to attend meals, groups, and work on treatment plan and goals. Labs, vital signs and patient behavior monitored throughout shift.   Response: Patient remains safe on the unit at this time and agrees to come to staff with any issues/concerns. Patient is interacting with peers appropriately on the unit. Will continue to support and monitor.

## 2018-01-10 NOTE — Progress Notes (Signed)
  [facility] Adult Case Management Discharge Plan :  Will you be returning to the same living situation after discharge:  Yes,  with friend At discharge, do you have transportation home?: Yes,  friend Do you have the ability to pay for your medications: Yes,  medicaid  Release of information consent forms completed and in the chart;  Patient's signature needed at discharge.  Patient to Follow up at: [facility]up Information    [facility] [employer] Go on 01/13/2018.   Why:  Please attend your appt on Monday, 01/13/18, at 12:15pm.  Please bring photo ID, social security card, and medicaid card. Contact information: [address] [place] [address] [phone]        Remer Macho Follow up.   Why:  Your Child psychotherapist will call you when Mr. Lewis returns our call to schedule your next therapy appt. Contact information: [address]  [place], [place], [address] P: [phone]          Next level of care provider has access to [facility] Link:no  Safety Planning and Suicide Prevention discussed: Yes,  with friend  Have you used any form of tobacco in the last 30 days? (Cigarettes, Smokeless Tobacco, Cigars, and/or Pipes): No  Has patient been referred to the Quitline?: N/A patient is not a smoker  Patient has been referred for addiction treatment: N/A  Lorri Frederick, LCSW 01/10/2018, 9:58 AM

## 2018-01-10 NOTE — Progress Notes (Signed)
CSW left message with therapist Remer Macho requesting appt date and time. Garner Nash, MSW, LCSW Clinical Social Worker 01/10/2018 9:18 AM

## 2018-01-10 NOTE — Progress Notes (Signed)
Patient ID: Deborah Hubbard, female   DOB: [birth year], 17 y.o.   MRN: [id number]  Discharge Note  D) Patient discharged to lobby. Patient states readiness for discharge. Patient denies SI/HI, AVH and is not delusional or psychotic. Patient in no acute distress. Patient has completed their Suicide Safety Plan. Patient provided an opportunity to complete and return Patient Satisfaction Survey.   A) Written and verbal discharge instructions given to the patient. Patient accepting to information and verbalized understanding. Patient agrees to the discharge plan. Opportunity for questions and concerns presented to patient. Patient denied any further questions or concerns. All belongings returned to patient. Patient signed for return of belongings and discharge paperwork. Patient provided a copy of their Suicide Safety Plan and has been provided a copy of the Patient Satisfaction Survey with return instructions.  R) Patient safely escorted to the lobby. Patient discharged from [facility] with prescriptions, personal belongings, follow-up appointment in place and discharge paperwork.

## 2018-01-10 NOTE — Progress Notes (Signed)
Adult Psychoeducational Group Note  Date:  01/10/2018 Time:  3:08 AM  Group Topic/Focus:  Wrap-Up Group:   The focus of this group is to help patients review their daily goal of treatment and discuss progress on daily workbooks.  Participation Level:  Active  Participation Quality:  Appropriate  Affect:  Appropriate  Cognitive:  Appropriate  Insight: Appropriate  Engagement in Group:  Engaged  Modes of Intervention:  Discussion  Additional Comments:  Pt's goal was to do her best to get rid of negativity and remain positive.  Pt stated she met her goal. Pt stated today was one of the best days she has had in very long time.  Pt also stated that she is aware that it's very important for her to create boundaries with several relationships once she is discharged.  Pt rated the day at a 10/10.  Aly Seidenberg 01/10/2018, 3:08 AM

## 2018-01-11 LAB — PROLACTIN: Prolactin: 20.4 ng/mL (ref 4.8–23.3)

## 2018-12-26 ENCOUNTER — Encounter (HOSPITAL_COMMUNITY): Payer: Self-pay

## 2018-12-26 ENCOUNTER — Emergency Department (HOSPITAL_COMMUNITY)
Admission: EM | Admit: 2018-12-26 | Discharge: 2018-12-26 | Discharge status: Against Medical Advice | Payer: Medicaid Other | Attending: Emergency Medicine | Admitting: Emergency Medicine

## 2018-12-26 ENCOUNTER — Other Ambulatory Visit: Payer: Self-pay

## 2018-12-26 DIAGNOSIS — Z5321 Procedure and treatment not carried out due to patient leaving prior to being seen by health care provider: Secondary | ICD-10-CM | POA: Diagnosis not present

## 2018-12-26 DIAGNOSIS — R109 Unspecified abdominal pain: Secondary | ICD-10-CM | POA: Diagnosis present

## 2018-12-26 LAB — COMPREHENSIVE METABOLIC PANEL
ALT: 43 U/L (ref 0–44)
AST: 30 U/L (ref 15–41)
Albumin: 3.7 g/dL (ref 3.5–5.0)
Alkaline Phosphatase: 52 U/L (ref 38–126)
Anion gap: 10 (ref 5–15)
BUN: 14 mg/dL (ref 6–20)
CO2: 21 mmol/L — ABNORMAL LOW (ref 22–32)
Calcium: 9.4 mg/dL (ref 8.9–10.3)
Chloride: 105 mmol/L (ref 98–111)
Creatinine, Ser: 0.57 mg/dL (ref 0.44–1.00)
GFR calc Af Amer: >60 mL/min (ref >60)
GFR calc non Af Amer: >60 mL/min (ref >60)
Glucose, Bld: 103 mg/dL — ABNORMAL HIGH (ref 70–99)
Potassium: 4 mmol/L (ref 3.5–5.1)
Sodium: 136 mmol/L (ref 135–145)
Total Bilirubin: 0.5 mg/dL (ref 0.3–1.2)
Total Protein: 6.7 g/dL (ref 6.5–8.1)

## 2018-12-26 LAB — URINALYSIS, ROUTINE W REFLEX MICROSCOPIC
Bilirubin Urine: NEGATIVE
Glucose, UA: NEGATIVE mg/dL
Hgb urine dipstick: NEGATIVE
Ketones, ur: NEGATIVE mg/dL
Leukocytes,Ua: NEGATIVE
Nitrite: NEGATIVE
Protein, ur: NEGATIVE mg/dL
Specific Gravity, Urine: 1.027 (ref 1.005–1.030)
pH: 6 (ref 5.0–8.0)

## 2018-12-26 LAB — CBC
HCT: 40.8 % (ref 36.0–46.0)
Hemoglobin: 14.5 g/dL (ref 12.0–15.0)
MCH: 31 pg (ref 26.0–34.0)
MCHC: 35.5 g/dL (ref 30.0–36.0)
MCV: 87.2 fL (ref 80.0–100.0)
Platelets: 317 10*3/uL (ref 150–400)
RBC: 4.68 MIL/uL (ref 3.87–5.11)
RDW: 12.7 % (ref 11.5–15.5)
WBC: 10.6 10*3/uL — ABNORMAL HIGH (ref 4.0–10.5)
nRBC: 0 % (ref 0.0–0.2)

## 2018-12-26 LAB — I-STAT BETA HCG BLOOD, ED (MC, WL, AP ONLY): I-stat hCG, quantitative: <5 m[IU]/mL (ref <5)

## 2018-12-26 LAB — LIPASE, BLOOD: Lipase: 24 U/L (ref 11–51)

## 2018-12-26 NOTE — ED Notes (Signed)
Pt states she is leaving due to her ride also a pt that decided to leave due to wait.  Encouraged both of them to stay and she encouraged her ride to stay as well and he declined so she said she had to leave as well.

## 2018-12-26 NOTE — ED Triage Notes (Signed)
Pt arrives POV for eval of abd pain, rectal bleeding, pelvic pain and UTI since "the beginning of the year". Pt reports she was seen at PCP office today and they advised she present here for CT Scan. Pt w/ multiple complaints. States her urine is orange, unsure if on Azo or not. States she has been on and off of abx since beginning of the year.

## 2019-03-14 ENCOUNTER — Emergency Department (HOSPITAL_COMMUNITY): Payer: Medicaid Other

## 2019-03-14 ENCOUNTER — Inpatient Hospital Stay (HOSPITAL_COMMUNITY)
Admission: EM | Admit: 2019-03-14 | Discharge: 2019-03-15 | DRG: 880 | Disposition: A | Discharge status: Home / Self Care | Payer: Medicaid Other | Attending: Internal Medicine | Admitting: Internal Medicine

## 2019-03-14 ENCOUNTER — Encounter (HOSPITAL_COMMUNITY): Payer: Self-pay | Admitting: Internal Medicine

## 2019-03-14 ENCOUNTER — Other Ambulatory Visit: Payer: Self-pay

## 2019-03-14 DIAGNOSIS — E669 Obesity, unspecified: Secondary | ICD-10-CM | POA: Diagnosis present

## 2019-03-14 DIAGNOSIS — F4310 Post-traumatic stress disorder, unspecified: Secondary | ICD-10-CM

## 2019-03-14 DIAGNOSIS — R569 Unspecified convulsions: Secondary | ICD-10-CM

## 2019-03-14 DIAGNOSIS — F332 Major depressive disorder, recurrent severe without psychotic features: Secondary | ICD-10-CM | POA: Diagnosis present

## 2019-03-14 DIAGNOSIS — Z6841 Body Mass Index (BMI) 40.0 and over, adult: Secondary | ICD-10-CM

## 2019-03-14 DIAGNOSIS — Z20828 Contact with and (suspected) exposure to other viral communicable diseases: Secondary | ICD-10-CM | POA: Diagnosis present

## 2019-03-14 DIAGNOSIS — F319 Bipolar disorder, unspecified: Secondary | ICD-10-CM

## 2019-03-14 DIAGNOSIS — J45909 Unspecified asthma, uncomplicated: Secondary | ICD-10-CM | POA: Diagnosis present

## 2019-03-14 DIAGNOSIS — F445 Conversion disorder with seizures or convulsions: Principal | ICD-10-CM | POA: Diagnosis present

## 2019-03-14 DIAGNOSIS — N390 Urinary tract infection, site not specified: Secondary | ICD-10-CM | POA: Diagnosis present

## 2019-03-14 DIAGNOSIS — Z833 Family history of diabetes mellitus: Secondary | ICD-10-CM

## 2019-03-14 DIAGNOSIS — Z23 Encounter for immunization: Secondary | ICD-10-CM

## 2019-03-14 DIAGNOSIS — F419 Anxiety disorder, unspecified: Secondary | ICD-10-CM | POA: Diagnosis not present

## 2019-03-14 HISTORY — DX: Bipolar disorder, unspecified: F31.9

## 2019-03-14 LAB — CBC
HCT: 40.1 % (ref 36.0–46.0)
Hemoglobin: 13.6 g/dL (ref 12.0–15.0)
MCH: 30 pg (ref 26.0–34.0)
MCHC: 33.9 g/dL (ref 30.0–36.0)
MCV: 88.5 fL (ref 80.0–100.0)
Platelets: 305 10*3/uL (ref 150–400)
RBC: 4.53 MIL/uL (ref 3.87–5.11)
RDW: 13 % (ref 11.5–15.5)
WBC: 9.8 10*3/uL (ref 4.0–10.5)
nRBC: 0 % (ref 0.0–0.2)

## 2019-03-14 LAB — COMPREHENSIVE METABOLIC PANEL
ALT: 23 U/L (ref 0–44)
AST: 20 U/L (ref 15–41)
Albumin: 3.5 g/dL (ref 3.5–5.0)
Alkaline Phosphatase: 53 U/L (ref 38–126)
Anion gap: 9 (ref 5–15)
BUN: 11 mg/dL (ref 6–20)
CO2: 21 mmol/L — ABNORMAL LOW (ref 22–32)
Calcium: 8.8 mg/dL — ABNORMAL LOW (ref 8.9–10.3)
Chloride: 109 mmol/L (ref 98–111)
Creatinine, Ser: 0.59 mg/dL (ref 0.44–1.00)
GFR calc Af Amer: >60 mL/min (ref >60)
GFR calc non Af Amer: >60 mL/min (ref >60)
Glucose, Bld: 77 mg/dL (ref 70–99)
Potassium: 3.8 mmol/L (ref 3.5–5.1)
Sodium: 139 mmol/L (ref 135–145)
Total Bilirubin: 0.4 mg/dL (ref 0.3–1.2)
Total Protein: 6.4 g/dL — ABNORMAL LOW (ref 6.5–8.1)

## 2019-03-14 LAB — D-DIMER, QUANTITATIVE: D-Dimer, Quant: 0.28 ug/mL-FEU (ref 0.00–0.50)

## 2019-03-14 LAB — I-STAT BETA HCG BLOOD, ED (MC, WL, AP ONLY): I-stat hCG, quantitative: <5 m[IU]/mL (ref <5)

## 2019-03-14 LAB — HIV ANTIBODY (ROUTINE TESTING W REFLEX): HIV Screen 4th Generation wRfx: NONREACTIVE

## 2019-03-14 LAB — POC SARS CORONAVIRUS 2 AG -  ED: SARS Coronavirus 2 Ag: NEGATIVE

## 2019-03-14 MED ORDER — ACETAMINOPHEN 650 MG RE SUPP: 650.0000 mg | Freq: Four times a day (QID) | RECTAL | Status: DC | PRN

## 2019-03-14 MED ORDER — SENNOSIDES-DOCUSATE SODIUM 8.6-50 MG PO TABS: 1.0000 | ORAL_TABLET | Freq: Every evening | ORAL | Status: DC | PRN

## 2019-03-14 MED ORDER — ACETAMINOPHEN 325 MG PO TABS: 650.0000 mg | ORAL_TABLET | Freq: Four times a day (QID) | ORAL | Status: DC | PRN

## 2019-03-14 MED ORDER — LAMOTRIGINE 100 MG PO TABS
100.0000 mg | ORAL_TABLET | Freq: Two times a day (BID) | ORAL | Status: DC
  Filled 2019-03-14: qty 1

## 2019-03-14 MED ORDER — TIZANIDINE HCL 4 MG PO TABS
4.0000 mg | ORAL_TABLET | Freq: Four times a day (QID) | ORAL | Status: DC | PRN
  Administered 2019-03-15: 01:00:00 4 mg via ORAL
  Filled 2019-03-14 (×2): qty 1

## 2019-03-14 MED ORDER — NITROFURANTOIN MONOHYD MACRO 100 MG PO CAPS
100.0000 mg | ORAL_CAPSULE | Freq: Two times a day (BID) | ORAL | Status: DC
  Administered 2019-03-14 – 2019-03-15 (×3): 100 mg via ORAL
  Filled 2019-03-14 (×3): qty 1

## 2019-03-14 MED ORDER — ARIPIPRAZOLE 2 MG PO TABS
2.0000 mg | ORAL_TABLET | Freq: Every day | ORAL | Status: DC
  Administered 2019-03-15: 2 mg via ORAL
  Filled 2019-03-14: qty 1

## 2019-03-14 MED ORDER — ENOXAPARIN SODIUM 40 MG/0.4ML ~~LOC~~ SOLN
40.0000 mg | SUBCUTANEOUS | Status: DC
  Administered 2019-03-15: 09:00:00 40 mg via SUBCUTANEOUS
  Filled 2019-03-14: qty 0.4

## 2019-03-14 MED ORDER — SODIUM CHLORIDE 0.9% FLUSH
3.0000 mL | Freq: Two times a day (BID) | INTRAVENOUS | Status: DC
  Administered 2019-03-14 – 2019-03-15 (×2): 3 mL via INTRAVENOUS

## 2019-03-14 MED ORDER — SERTRALINE HCL 100 MG PO TABS
100.0000 mg | ORAL_TABLET | Freq: Every day | ORAL | Status: DC
  Administered 2019-03-15: 09:00:00 100 mg via ORAL
  Filled 2019-03-14: qty 1

## 2019-03-14 NOTE — ED Triage Notes (Signed)
Pt reports she thinks she is having seizures per her psychiatrist.  Pt does not know what is going on and denies any history or seizures.  Pt in NAD at this time

## 2019-03-14 NOTE — Consult Note (Signed)
Neurology Consultation  Reason for Consult: Seizure-like activity Referring Physician: Dr. Pattricia Boss  CC: Seizure-like activity History is obtained from: Patient, chart  HPI: Deborah Hubbard is a 18 y.o. female significant past medical history of depression, admission to behavioral health last ER for thoughts of self-harm and severe depression, brought to the emergency room for episodes concerning for seizure-like activity. According to the patient, ever since December 3 of 2020, she has been having these movements with her head which she describes as front to back nodding that happen anywhere from 8-23 times a day.  She denies any loss of consciousness with these episodes.  Denies any preceding aura but reports a feeling of sharp chest pain before she has these episodes.  No bowel bladder incontinence.  Denies any association with anxiety with these-that this could happen when she is perfectly normal watching TV and happy.  She initially thought that these are anxiety related symptoms as she has had problems with anxiety and depression in the past, but her mother has been worried that these episodes are very frequent.  Her psychologist also thought that these might be seizures and wanted them to see a neurologist.  There was an outpatient neurology consultation but that appointment has not materialized yet.  She was brought into the emergency room because of continuing frequency of these seizure-like episodes. She has a 15-1/2-year-old son, who she takes care of.  She works at [employer] Denies current anxiety depression.    She has been seen at an outside ER, noncontrast head CT on 03/12/2019 at [facility] chart -- negative for acute process.  ROS: A 14 point ROS was performed and is negative except as noted in the HPI.   Past Medical History:  Diagnosis Date  . Asthma   . Depression    No family history on file.   Social History:   reports that she has never smoked. She  has never used smokeless tobacco. She reports that she does not drink alcohol or use drugs.  Medications No current facility-administered medications for this encounter.  Current Outpatient Medications:  .  ARIPiprazole (ABILIFY) 2 MG tablet, Take 1 tablet (2 mg total) by mouth daily. For mood control, Disp: 30 tablet, Rfl: 0 .  ciprofloxacin (CIPRO) 500 MG tablet, Take 1 tablet (500 mg total) by mouth 2 (two) times daily. X 7 days: For urinary tract infection, Disp: 13 tablet, Rfl: 0 .  hydrOXYzine (ATARAX/VISTARIL) 25 MG tablet, Take 1 tablet (25 mg total) by mouth 3 (three) times daily as needed for anxiety., Disp: 60 tablet, Rfl: 0 .  norelgestromin-ethinyl estradiol (ORTHO EVRA) 150-35 MCG/24HR transdermal patch, Place 1 patch onto the skin once a week. For pregnancy prevention., Disp: 3 patch, Rfl: 12 .  sertraline (ZOLOFT) 50 MG tablet, Take 1 tablet (50 mg total) by mouth daily. For depression, Disp: 30 tablet, Rfl: 0 .  traZODone (DESYREL) 50 MG tablet, Take 1 tablet (50 mg total) by mouth at bedtime as needed for sleep., Disp: 30 tablet, Rfl: 0   Exam: Current vital signs: BP 140/83 (BP Location: Left Arm)   Pulse (!) 102   Temp 98.9 F (37.2 C) (Oral)   Resp 16   SpO2 100%  Vital signs in last 24 hours: Temp:  [98.9 F (37.2 C)] 98.9 F (37.2 C) (12/19 1718) Pulse Rate:  [102] 102 (12/19 1718) Resp:  [16] 16 (12/19 1718) BP: (140)/(83) 140/83 (12/19 1718) SpO2:  [100 %] 100 % (12/19 1718)  GENERAL: Awake, alert in NAD HEENT: - Normocephalic and atraumatic, dry mm, no LN++, no Thyromegally LUNGS - Clear to auscultation bilaterally with no wheezes CV - S1S2 RRR, no m/r/g, equal pulses bilaterally. ABDOMEN - Soft, nontender, nondistended with normoactive BS Ext: warm, well perfused, intact peripheral pulses, no edema  NEURO:  Mental Status: AA&Ox3  Language: speech is nondysarthric.  Naming, repetition, fluency, and comprehension intact. Cranial Nerves: PERRL. EOMI  with 5-6 beats of end gaze nystagmus on both and gazes, visual fields full, no facial asymmetry, facial sensation intact, hearing intact, tongue/uvula/soft palate midline, normal sternocleidomastoid and trapezius muscle strength. No evidence of tongue atrophy or fibrillations Motor: Symmetric 5/5 antigravity. Tone: is normal and bulk is normal.  No cogwheeling. Sensation- Intact to light touch bilaterally Coordination: FTN intact bilaterally Gait- deferre  NIH stroke scale 0   Labs I have reviewed labs in epic and the results pertinent to this consultation are:  CBC    Component Value Date/Time   WBC 9.8 03/14/2019 1825   RBC 4.53 03/14/2019 1825   HGB 13.6 03/14/2019 1825   HCT 40.1 03/14/2019 1825   PLT 305 03/14/2019 1825   MCV 88.5 03/14/2019 1825   MCH 30.0 03/14/2019 1825   MCHC 33.9 03/14/2019 1825   RDW 13.0 03/14/2019 1825    CMP     Component Value Date/Time   NA 139 03/14/2019 1825   K 3.8 03/14/2019 1825   CL 109 03/14/2019 1825   CO2 21 (L) 03/14/2019 1825   GLUCOSE 77 03/14/2019 1825   BUN 11 03/14/2019 1825   CREATININE 0.59 03/14/2019 1825   CALCIUM 8.8 (L) 03/14/2019 1825   PROT 6.4 (L) 03/14/2019 1825   ALBUMIN 3.5 03/14/2019 1825   AST 20 03/14/2019 1825   ALT 23 03/14/2019 1825   ALKPHOS 53 03/14/2019 1825   BILITOT 0.4 03/14/2019 1825   GFRNONAA >60 03/14/2019 1825   GFRAA >60 03/14/2019 1825     Imaging I have reviewed the images obtained: CT-scan of the brain performed today-no intracranial abnormality.  03/12/2019 had a CT head at [facility]-also reported normal.  Assessment: 18 year old woman with a significant history of anxiety/depression, no current homicidal or suicidal ideation or self-harm intent, presenting with episodes of head bobbing concerning for seizure activity. Does not lose consciousness during this.  No bowel or incontinence.  Reports no gustatory olfactory or sensory aura but reports a feeling of sharp chest pain before  these episodes. 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 Work-up to include EEG and MRI should be done to rule out any structural or electrographic abnormalities as a significant percentage of psychogenic nonepileptic seizure patients do have underlying electrographic abnormalities requiring antiepileptics.  She is currently on is currently on Lamictal for her mood, along with sertraline.  She is also on aripiprazole-no parkinsonian side effects noted on exam.  Impression: Seizure-like activity-evaluate for psychogenic nonepileptic seizures  Recommendations: Observation Frequent neurochecks Maintain on telemetry Maintain seizure precautions MRI brain with and without contrast Routine EEG in the morning If the routine EEG fails to record an event, will benefit from outpatient EMU admission. No antiepileptics Do not use Ativan until a seizure lasts more than 5 minutes.  Please call neurology at that time as well. Plan discussed with ED provider. Plan also discussed with the patient.  Explained the rationale for work-up and introducing psychogenic nonepileptic seizures as etiology which the patient was very receptive to. Neurology will follow.   -- Milon Dikes, MD [facility]  Pager: [phone] If 7pm to 7am, please call on call as listed on AMION.

## 2019-03-14 NOTE — ED Provider Notes (Signed)
[facility] EMERGENCY DEPARTMENT Provider Note   CSN: [id number] Arrival date & time: 03/14/19  1716     History Chief Complaint  Patient presents with  . Seizures    Ane Conerly is a 18 y.o. female.  HPI  18 yo female hos asthma depression states she has had head shaking and severe chest pain, states she can barely hear people talking to her.  She states this has happened several times a day every day. Lives with her mom and 1 yo.  States she has anxiety depression and bpad.  Primary is Dr. Carney Corners in [place].  States she had ct scan at [facility].  Patient with cough and sore throat.  Denies known covid exposure.    Mother states she has episodes of her head shaking that lasted 30 minutes 30 seconds to 1 minute.  That she becomes weak and they have to hold her up.  She reports that the started several weeks ago and have become more frequent and she had 9 in a row yesterday.  There is no report of loss of bladder control tongue contusion or biting. Past Medical History:  Diagnosis Date  . Asthma   . Depression     Patient Active Problem List   Diagnosis Date Noted  . Severe recurrent major depression without psychotic features (HCC) 01/07/2018    No past surgical history on file.   OB History   No obstetric history on file.     No family history on file.  Social History   Tobacco Use  . Smoking status: Never Smoker  . Smokeless tobacco: Never Used  Substance Use Topics  . Alcohol use: Never  . Drug use: Never    Home Medications Prior to Admission medications   Medication Sig Start Date End Date Taking? Authorizing Provider  ARIPiprazole (ABILIFY) 2 MG tablet Take 1 tablet (2 mg total) by mouth daily. For mood control 01/11/18   Armandina Stammer I, NP  ciprofloxacin (CIPRO) 500 MG tablet Take 1 tablet (500 mg total) by mouth 2 (two) times daily. X 7 days: For urinary tract infection 01/10/18   Armandina Stammer I, NP  hydrOXYzine  (ATARAX/VISTARIL) 25 MG tablet Take 1 tablet (25 mg total) by mouth 3 (three) times daily as needed for anxiety. 01/10/18   Armandina Stammer I, NP  norelgestromin-ethinyl estradiol (ORTHO EVRA) 150-35 MCG/24HR transdermal patch Place 1 patch onto the skin once a week. For pregnancy prevention. 01/15/18   Armandina Stammer I, NP  sertraline (ZOLOFT) 50 MG tablet Take 1 tablet (50 mg total) by mouth daily. For depression 01/11/18   Armandina Stammer I, NP  traZODone (DESYREL) 50 MG tablet Take 1 tablet (50 mg total) by mouth at bedtime as needed for sleep. 01/10/18   Sanjuana Kava, NP    Allergies    Patient has no known allergies.  Review of Systems   Review of Systems  Physical Exam Updated Vital Signs BP 140/83 (BP Location: Left Arm)   Pulse (!) 102   Temp 98.9 F (37.2 C) (Oral)   Resp 16   SpO2 100%   Physical Exam Vitals and nursing note reviewed.  Constitutional:      General: She is not in acute distress.    Appearance: She is obese. She is not ill-appearing.  HENT:     Head: Normocephalic.     Right Ear: External ear normal.     Left Ear: External ear normal.     Nose:  Nose normal.     Mouth/Throat:     Mouth: Mucous membranes are moist.     Pharynx: Oropharynx is clear.  Eyes:     Extraocular Movements: Extraocular movements intact.     Pupils: Pupils are equal, round, and reactive to light.  Cardiovascular:     Rate and Rhythm: Normal rate and regular rhythm.     Pulses: Normal pulses.     Heart sounds: Normal heart sounds.  Pulmonary:     Effort: Pulmonary effort is normal.     Breath sounds: Normal breath sounds.  Abdominal:     General: There is no distension.     Palpations: Abdomen is soft. There is no mass.  Musculoskeletal:        General: Normal range of motion.     Cervical back: Normal range of motion.  Skin:    General: Skin is warm and dry.     Capillary Refill: Capillary refill takes less than 2 seconds.  Neurological:     General: No focal deficit  present.     Mental Status: She is alert and oriented to person, place, and time.     Cranial Nerves: No cranial nerve deficit.     Sensory: No sensory deficit.     Motor: No weakness.     Coordination: Coordination normal.     Deep Tendon Reflexes: Reflexes normal.     Comments: During exam, patient had isolated head shaking in vertical plane.  Remained awake, shaking stopped after 30 seconds and patient conversant.  States this is what happens     ED Results / Procedures / Treatments   Labs (all labs ordered are listed, but only abnormal results are displayed) Labs Reviewed - No data to display  EKG None  Radiology No results found.  Procedures Procedures (including critical care time)  Medications Ordered in ED Medications - No data to display  ED Course  I have reviewed the triage vital signs and the nursing notes.  Pertinent labs & imaging results that were available during my care of the patient were reviewed by me and considered in my medical decision making (see chart for details).    MDM Rules/Calculators/A&P                      Patient remains stable here in ED Labs and ct head reviewed Discussed with Dr. Malen Gauze and plan admission to medicine with plan for eeg in am discussed with internal medicine teaching service and will admit with above plan in place.  Final Clinical Impression(s) / ED Diagnoses Final diagnoses:  Seizure-like activity [facility])    Rx / [place] Orders ED Discharge Orders    None       Pattricia Boss, MD 03/14/19 1950

## 2019-03-14 NOTE — H&P (Signed)
Date: 03/14/2019               Patient Name:  Deborah Hubbard MRN: [id number]  DOB: 06-Mar-2001 Age / Sex: 18 y.o., female   PCP: Patient, No Pcp Per         Medical Service: Internal Medicine Teaching Service         Attending Physician: Dr. Rebeca Alert, Raynaldo Opitz, MD    First Contact: Dr. Gilford Rile Pager: [phone]  Second Contact: Dr. Eileen Stanford Pager: [phone]       After Hours (After 5p/  First Contact Pager: [phone]  weekends / holidays): Second Contact Pager: [phone]   Chief Complaint: Seizure-like activity  History of Present Illness:   Deborah Hubbard is a 18 year old female with a past medical history of bipolar disorder, depression, anxiety, PTSD who presents to the ED with complaints of seizure-like activity.  Deborah Hubbard states that the seizure-like activities started happening on December 3 while she was at work.  She notes she was not experiencing any stressors.  Since then, she has multiple episodes per day with most being 23 episodes and the least being 8 episodes.  These episodes last anywhere from 30 seconds to 20 minutes.  Each episode starts with head jerking and progresses to chest tightness, shortness of breath and palpitations.  After the episodes resolve, she describes feeling dizzy with blurry vision and bilateral frontal headache. No bowel or bladder incontinence. She denies any recent changes in diet or exercise.  Denies any drug use or alcohol use.  Denies any recent illnesses but does endorse chronic chills.  Patient also endorses intermittent nausea and left upper quadrant abdominal pain that is also intermittent and not exacerbated by eating.  Denies vomiting, diarrhea,  Since onset of seizure-like activity, patient has seen her PCP twice and was told that these are panic attacks.  She did go see her psychiatrist, who does not feel like this is panic attacks, per patient. She went to the [facility] ED 2 days ago, at which time she was discharged with  a neurology referral.  At her last PCP follow-up 3 days ago, she was diagnosed with a UTI and started on Macrobid.  ED course: CT head negative.  All lab work including CBC, CMP and hCG unremarkable.  Neurology consulted by ED.  Meds:  Current Meds  Medication Sig  . ARIPiprazole (ABILIFY) 2 MG tablet Take 1 tablet (2 mg total) by mouth daily. For mood control  . lamoTRIgine (LAMICTAL) 100 MG tablet Take 100 mg by mouth 2 (two) times daily.  . nitrofurantoin (MACRODANTIN) 100 MG capsule Take 100 mg by mouth 4 (four) times daily.  . sertraline (ZOLOFT) 50 MG tablet Take 1 tablet (50 mg total) by mouth daily. For depression (Patient taking differently: Take 100 mg by mouth daily. For depression)  . tiZANidine (ZANAFLEX) 4 MG tablet Take 4 mg by mouth every 6 (six) hours as needed for muscle spasms.   Allergies: Allergies as of 03/14/2019  . (No Known Allergies)   Past Medical History:  Diagnosis Date  . Asthma   . Depression    Family History:  Father: Diabetes  Mother: Diabetes Breast Cancer: Maternal Grandmother   Social History:  Tobacco: Denies Drug: Denies  Alcohol: Denies   Review of Systems: A complete ROS was negative except as per HPI.   Physical Exam: Blood pressure 140/83, pulse (!) 102, temperature 98.9 F (37.2 C), temperature source Oral, resp. rate 16, SpO2  100 %.  Physical Exam Vitals and nursing note reviewed.  Constitutional:      General: She is not in acute distress.    Appearance: She is obese.  Eyes:     General: No visual field deficit. Cardiovascular:     Rate and Rhythm: Normal rate and regular rhythm.     Heart sounds: No murmur.  Pulmonary:     Effort: Pulmonary effort is normal. No respiratory distress.     Breath sounds: Normal breath sounds. No wheezing, rhonchi or rales.  Abdominal:     General: Bowel sounds are normal. There is no distension.     Palpations: Abdomen is soft.     Tenderness: There is no abdominal tenderness.    Musculoskeletal:        General: Normal range of motion.  Skin:    General: Skin is warm and dry.  Neurological:     General: No focal deficit present.     Mental Status: She is alert and oriented to person, place, and time. Mental status is at baseline.     Cranial Nerves: No cranial nerve deficit, dysarthria or facial asymmetry.     Sensory: No sensory deficit.     Comments: 5/5 strength both right upper and lower extremities. 4/5 strength in left upper extremity although with fluctuating effort. 5/5 left lower extremity   Psychiatric:        Mood and Affect: Mood normal.        Behavior: Behavior normal.        Thought Content: Thought content normal.        Judgment: Judgment normal.    Assessment & Plan by Problem: Active Problems:   Seizure-like activity (HCC)  #Seizure-like activity 3-week history of seizure-like activity that includes head jerking, chest pain, palpitations and shortness of breath.  Initially diagnosed as panic attacks by primary care provider.  Differential diagnosis includes seizure versus psychogenic nonepileptic seizures.  Characteristics of this seizure-like activity including no postictal state, no urinary incontinence and no loss of consciousness during episode, as described by ED provider, do not support true seizure.  No drug use, lab abnormalities or structural abnormalities noted on CT head.  Patient does have a complicated psychiatric history with multiple psychogenic medications. Will obtain EEG and MRI tomorrow as recommended by neurology as psychogenic seizure patients still are at risk of abnormalities requiring AEDs.   -Neurology is on a board and we appreciate their recommendations -Telemetry -Neurochecks every 4 hours -Seizure precautions -MRI brain with and without contrast ordered -EEG without video ordered -Holding Lamotrigine   Diet: Regular Code: Full DVT ppx: Lovenox  Dispo: Admit patient to Observation with expected length of  stay less than 2 midnights.  Signed: Dr. Verdene Lennert Internal Medicine PGY-1  Pager: [phone] 03/14/2019, 8:35 PM

## 2019-03-15 ENCOUNTER — Encounter (HOSPITAL_COMMUNITY): Payer: Self-pay | Admitting: Internal Medicine

## 2019-03-15 ENCOUNTER — Observation Stay (HOSPITAL_COMMUNITY): Payer: Medicaid Other

## 2019-03-15 ENCOUNTER — Inpatient Hospital Stay (HOSPITAL_COMMUNITY): Payer: Medicaid Other

## 2019-03-15 DIAGNOSIS — F4310 Post-traumatic stress disorder, unspecified: Secondary | ICD-10-CM | POA: Diagnosis not present

## 2019-03-15 DIAGNOSIS — N390 Urinary tract infection, site not specified: Secondary | ICD-10-CM | POA: Diagnosis present

## 2019-03-15 DIAGNOSIS — F319 Bipolar disorder, unspecified: Secondary | ICD-10-CM | POA: Diagnosis present

## 2019-03-15 DIAGNOSIS — R569 Unspecified convulsions: Secondary | ICD-10-CM | POA: Diagnosis present

## 2019-03-15 DIAGNOSIS — F419 Anxiety disorder, unspecified: Secondary | ICD-10-CM | POA: Diagnosis present

## 2019-03-15 DIAGNOSIS — Z6841 Body Mass Index (BMI) 40.0 and over, adult: Secondary | ICD-10-CM | POA: Diagnosis not present

## 2019-03-15 DIAGNOSIS — Z23 Encounter for immunization: Secondary | ICD-10-CM | POA: Diagnosis not present

## 2019-03-15 DIAGNOSIS — F445 Conversion disorder with seizures or convulsions: Secondary | ICD-10-CM | POA: Diagnosis present

## 2019-03-15 DIAGNOSIS — E669 Obesity, unspecified: Secondary | ICD-10-CM | POA: Diagnosis present

## 2019-03-15 DIAGNOSIS — Z20828 Contact with and (suspected) exposure to other viral communicable diseases: Secondary | ICD-10-CM | POA: Diagnosis present

## 2019-03-15 DIAGNOSIS — J45909 Unspecified asthma, uncomplicated: Secondary | ICD-10-CM | POA: Diagnosis present

## 2019-03-15 DIAGNOSIS — Z833 Family history of diabetes mellitus: Secondary | ICD-10-CM | POA: Diagnosis not present

## 2019-03-15 MED ORDER — INFLUENZA VAC A&B SA ADJ QUAD 0.5 ML IM PRSY: 0.5000 mL | PREFILLED_SYRINGE | Freq: Once | INTRAMUSCULAR | 0 refills | Status: AC

## 2019-03-15 MED ORDER — INFLUENZA VAC A&B SA ADJ QUAD 0.5 ML IM PRSY
0.5000 mL | PREFILLED_SYRINGE | Freq: Once | INTRAMUSCULAR | Status: DC
  Filled 2019-03-15 (×2): qty 0.5

## 2019-03-15 MED ORDER — GADOBUTROL 1 MMOL/ML IV SOLN
10.0000 mL | Freq: Once | INTRAVENOUS | Status: AC | PRN
  Administered 2019-03-15: 14:00:00 10 mL via INTRAVENOUS

## 2019-03-15 MED ORDER — INFLUENZA VAC SPLIT QUAD 0.5 ML IM SUSY
0.5000 mL | PREFILLED_SYRINGE | INTRAMUSCULAR | Status: AC
  Administered 2019-03-15: 0.5 mL via INTRAMUSCULAR

## 2019-03-15 MED ORDER — INFLUENZA VAC SPLIT QUAD 0.5 ML IM SUSY: 0.5000 mL | PREFILLED_SYRINGE | INTRAMUSCULAR | Status: DC

## 2019-03-15 MED ORDER — SERTRALINE HCL 100 MG PO TABS: 100.0000 mg | ORAL_TABLET | Freq: Every day | ORAL | 0 refills | Status: AC

## 2019-03-15 MED ORDER — LAMOTRIGINE 100 MG PO TABS
100.0000 mg | ORAL_TABLET | Freq: Two times a day (BID) | ORAL | Status: DC
  Administered 2019-03-15 (×2): 100 mg via ORAL
  Filled 2019-03-15 (×2): qty 1

## 2019-03-15 NOTE — Plan of Care (Signed)
Resuming Lamictal as it is prescribed to her for mood, not for sz and abrupt cessation might require re-initiation with slow taper.  Would leave Lamictal it as is-100BID. No need for additional AED.  -- Amie Portland, MD

## 2019-03-15 NOTE — Discharge Instructions (Signed)
Thank you for choosing [facility] for your healthcare management. You were admitted for seizure like activity. During your stay, you were evaluated for seizures. After evaluation, your labs were normal, your imaging was clear, and your EEG showed non-epileptic activity (showed that your episodes are not seizures). Please reach out to your primary care physician and psychiatrist this week for an appointment and to update them on your condition. If you begin to notice a rapid decline in your normal daily health, please come back for reevaluation.

## 2019-03-15 NOTE — Progress Notes (Signed)
Pt alert and oriented x 4. Vitals WDL. Pt stable. No acute distress noted. Pt stable.  Ambulatory at discharged. Teaching given about discharge education , pt verbalized understanding. Emphasis given on medication dose change. Medications to be picked up at pharmacy. Flu vaccine given prior to discharge w/education. Pt's mother came to pick her up, pt assisted to main entrance for her ride home.

## 2019-03-15 NOTE — Plan of Care (Signed)
Pt understood plan of care. Knowledgeable about plan of care and discharge instructions. Education given on discharge medications. Pt verbalized understanding.

## 2019-03-15 NOTE — Progress Notes (Addendum)
NEURO HOSPITALIST PROGRESS NOTE   Subjective: Patient in bed, awake, alert, NAD about to eat lunch. Reports 1 episode of head bobbing. Waiting on EEG and MRI. Reports hazy vision.   Exam: Vitals:   03/14/19 2152 03/15/19 0612  BP: (!) 146/73 (!) 112/55  Pulse: 83 83  Resp: 16 17  Temp: 98.2 F (36.8 C) 97.6 F (36.4 C)  SpO2: 97% 100%    Physical Exam  Constitutional: Appears well-developed and well-nourished.  Psych: Affect appropriate to situation Eyes: Normal external eye and conjunctiva. HENT: Normocephalic, no lesions, without obvious abnormality.   Musculoskeletal-no joint tenderness, deformity or swelling Cardiovascular: Normal rate and regular rhythm.  Respiratory: Effort normal, non-labored breathing saturations WNL on RA GI: Soft.  No distension. There is no tenderness.  Skin: WDI   Neuro:  Mental Status: Alert, oriented, thought content appropriate.  Speech fluent without evidence of aphasia.  Able to follow commands without difficulty. Cranial Nerves: II: Visual fields grossly normal,  III,IV, VI: ptosis not present, extra-ocular motions intact bilaterally pupils equal, round, reactive to light and accommodation V,VII: smile symmetric, facial light touch sensation decreased left side of face VIII: hearing normal bilaterally IX,X: uvula rises symmetrically XI: bilateral shoulder shrug XII: midline tongue extension Motor: Right : Upper extremity   5/5  Left:     Upper extremity   4+/5  Lower extremity   5/5   Lower extremity   5/5 Tone and bulk:normal tone throughout; no atrophy noted Sensory: cool temp and light touch sensation decreased left arm and leg Deep Tendon Reflexes: 2+ and symmetric biceps, patella Cerebellar: normal finger-to-nose,  and normal heel-to-shin test Gait: deferred    Medications:  Scheduled: . ARIPiprazole  2 mg Oral Daily  . enoxaparin (LOVENOX) injection  40 mg Subcutaneous Q24H  . lamoTRIgine  100 mg  Oral BID  . nitrofurantoin (macrocrystal-monohydrate)  100 mg Oral BID  . sertraline  100 mg Oral Daily  . sodium chloride flush  3 mL Intravenous Q12H   Continuous:  ASN:[id number] **OR** acetaminophen, senna-docusate, tiZANidine  Pertinent Labs/Diagnostics:   CT Head Wo Contrast  Result Date: 03/14/2019 CLINICAL DATA:  Seizure EXAM: CT HEAD WITHOUT CONTRAST TECHNIQUE: Contiguous axial images were obtained from the base of the skull through the vertex without intravenous contrast. COMPARISON:  None. FINDINGS: Brain: No evidence of acute territorial infarction, hemorrhage, hydrocephalus,extra-axial collection or mass lesion/mass effect. Normal gray-white differentiation. Ventricles are normal in size and contour. Vascular: No hyperdense vessel or unexpected calcification. Skull: The skull is intact. No fracture or focal lesion identified. Sinuses/Orbits: The visualized paranasal sinuses and mastoid air cells are clear. The orbits and globes intact. Other: None IMPRESSION: No acute intracranial abnormality. Electronically Signed   By: Prudencio Pair M.D.   On: 03/14/2019 18:45  Assessment: 18 year old woman with a significant history of anxiety/depression, no current homicidal or suicidal ideation or self-harm intent, presenting with episodes of head bobbing concerning for seizure activity. Does not lose consciousness during this.  No bowel or bladder incontinence.  Reports no gustatory olfactory or sensory aura but reports a feeling of sharp chest pain before these episodes. 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 Work-up to include EEG and MRI should be done to rule out any structural or electrographic abnormalities as a significant percentage of psychogenic nonepileptic seizure patients do have underlying electrographic abnormalities requiring antiepileptics. ( both are prending  at this time.)  She is currently on Lamictal for her mood, along with  sertraline.  She is also on aripiprazole- Impression: Seizure-like activity-evaluate for psychogenic nonepileptic seizures  Recommendations: Observation Frequent neurochecks Maintain on telemetry Maintain seizure precautions MRI brain with and without contrast ( ordered) Routine EEG (ordered) If the routine EEG fails to record an event, will benefit from outpatient EMU admission. -continue lamictal 100 mg BID ( for mood) No  Additional antiepileptics Do not use Ativan until a seizure lasts more than 5 minutes.  Please call neurology at that time as well.. Neurology will follow.  Valentina Lucks, MSN, NP-C [facility] [phone]  Attending neurologist's note to follow  03/15/2019, 10:26 AM   NEUROHOSPITALIST ADDENDUM Performed a face to face diagnostic evaluation.   I have reviewed the contents of history and physical exam as documented by PA/ARNP/Resident and agree with above documentation.  I have discussed and formulated the above plan as documented. Edits to the note have been made as needed.  Reviewed MRI brain unremarkable, EEG captured 2 spells, non epileptic.  Patient can be discharged, recommend outpatient psych follow up.  Patient states she would really prefer to go home.   Paged resident on call, waiting to hear back.    Georgiana Spinner Ruhan Borak MD [facility] [phone]   If 7pm to 7am, please call on call as listed on AMION.

## 2019-03-15 NOTE — Progress Notes (Signed)
   Subjective: Ms. Deborah Hubbard was seen and evaluated at bedside this morning. She states that she is doing well. She states that she did have an episode of head shaking last night which was witnessed by her nurse. She states that she has spoken with neurology and is ready to get her MRI and EEG. She has no complaints at this time. All questions and concerns were addressed.   Objective:  Vital signs in last 24 hours: Vitals:   03/14/19 1718 03/14/19 2152 03/15/19 0612  BP: 140/83 (!) 146/73 (!) 112/55  Pulse: (!) 102 83 83  Resp: 16 16 17   Temp: 98.9 F (37.2 C) 98.2 F (36.8 C) 97.6 F (36.4 C)  TempSrc: Oral Oral Oral  SpO2: 100% 97% 100%   Physical Exam Vitals and nursing note reviewed.  Constitutional:      General: She is not in acute distress.    Appearance: Normal appearance. She is not ill-appearing or toxic-appearing.     Comments: Patient resting comfortably in bed. No acute distress.   HENT:     Head: Normocephalic and atraumatic.  Eyes:     General:        Right eye: No discharge.        Left eye: No discharge.     Conjunctiva/sclera: Conjunctivae normal.  Cardiovascular:     Rate and Rhythm: Normal rate and regular rhythm.     Pulses: Normal pulses.     Heart sounds: Normal heart sounds. No murmur. No friction rub. No gallop.   Pulmonary:     Effort: Pulmonary effort is normal.     Breath sounds: Normal breath sounds. No wheezing, rhonchi or rales.  Abdominal:     General: Bowel sounds are normal.     Palpations: Abdomen is soft.     Tenderness: There is no abdominal tenderness. There is no guarding.  Musculoskeletal:        General: No swelling.     Right lower leg: No edema.     Left lower leg: No edema.  Neurological:     General: No focal deficit present.     Mental Status: She is alert and oriented to person, place, and time.  Psychiatric:        Mood and Affect: Mood normal.        Behavior: Behavior normal.      Assessment/Plan:  Active Problems:   Seizure-like activity ([place])  Melrose Kearse is a 18 y/o female, with a PMH of PTSD, anxiety, bipolar, and depression, who presented to the [facility] with seizure like activity.   Seizure Like Activity:  Patient presented with seizure like activity, with no post ictal state, self soiling, or tongue lacerations. Patient endorses blurry vision, and bilateral headaches. Currently working between differentiating between an underlying organic etiology for seizures vs PNES.  - MRI Brain W/WO contrast - EEG ordered  Dispo: Anticipated discharge pending medical course.   Maudie Mercury, MD 03/15/2019, 10:20 AM Pager: [phone]

## 2019-03-15 NOTE — Progress Notes (Signed)
EEG complete - results pending 

## 2019-03-16 NOTE — Procedures (Signed)
Patient Name: Shelisa Fern  MRN: [id number]  Epilepsy Attending: Lora Havens  Referring Physician/Provider:  Date: 03/15/2019 Duration: 28.30mins  Patient history: 18 year old female presented with episodes of head bobbing concerning for seizure activity.  EEG to evaluate for seizures.  Level of alertness: awake, asleep  AEDs during EEG study: LTG  Technical aspects: This EEG study was done with scalp electrodes positioned according to the 10-20 International system of electrode placement. Electrical activity was acquired at a sampling rate of 500Hz  and reviewed with a high frequency filter of 70Hz  and a low frequency filter of 1Hz . EEG data were recorded continuously and digitally stored.   DECSRIPTION: The posterior dominant rhythm consists of 8 Hz activity of moderate voltage (25-35 uV) seen predominantly in posterior head regions, symmetric and reactive to eye opening and eye closing.  Sleep was characterized by vertex waves, sleep spindles (12 to 14 Hz), maximal frontocentral.  Physiologic photic driving was not seen during photic stimulation.  No EEG change was seen during hyperventilation.  2 events of head-bobbing were recorded during this study.  Concomitant EEG before during and after the episodes did not show any EEG changes suggest seizures.  IMPRESSION: This study is within normal limits. No seizures or epileptiform discharges were seen throughout the recording.  2 episodes of head bobbing recorded during the study without any EEG change.  These were nonepileptic events.  Kynnedi Zweig Barbra Sarks

## 2019-04-06 NOTE — Discharge Summary (Addendum)
Name: Deborah Hubbard MRN: [id number] DOB: 01-03-2001 19 y.o. 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 For mood control   lamoTRIgine 100 MG tablet Commonly known as: LAMICTAL Take 100 mg by mouth 2 (two) times daily.   nitrofurantoin 100 MG capsule Commonly known as: MACRODANTIN Take 100 mg by mouth 4 (four) times daily.   sertraline 100 MG tablet Commonly known as: ZOLOFT Take 1 tablet (100 mg total) by mouth daily. What changed:  medication strength how much to take additional instructions   tiZANidine 4 MG tablet Commonly known as: ZANAFLEX Take 4 mg by mouth every 6 (six) hours as needed for muscle spasms.       ASK your doctor about these medications    influenza vaccine adjuvanted 0.5 ML injection Commonly known as: FLUAD Inject 0.5 mLs into the muscle once for 1 dose. Ask about: Should I take this medication?        Disposition and follow-up:   Deborah Hubbard was discharged from [facility] in Stable condition.  At the hospital follow up visit please address:  1.  Counseling about Non Epileptic Seizures/evaluation for psychotherapy.     2.  Labs / imaging needed at time of follow-up: N/A  3.  Pending labs/ test needing follow-up: N/A  Follow-up Appointments:    Hospital Course by problem list: 1. Non Epileptic Psychogenic Seiuzres:  Ms. Deborah Hubbard is a 19 y/o F, with a PMH of biploar disorder, depression, anxiety, PTSD, who presented on 03/14/19 for seizure like activity. Patient stated that her symptoms occurred on  February 26, 2019 and would have multiple episodes per day with a maximum of 23 episodes and a low of 8 episodes. Patient described these events as precipitating with chest tightness, SHOB, and palpitations, with head bobbing. On resolution of her symptoms she endorses feeling dizzy, with blurry vision, and bilateral frontal headache. Denied tongue lacerations, bladder, or fecal incontinence. She was placed under a supervised EEG where 2 of her events were noted, without EEG changes. The results of her EEG did not reveal seizures or epileptiform discharges. Patient was discharged home in stable condition. 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  Discharge Vitals:   BP 138/60 (BP Location: Right Arm)   Pulse 81   Temp 98.6 F (37 C) (Oral)   Resp 18   Ht 4\' 8"  (1.422 m)   Wt 130.2 kg   SpO2 99%   BMI 64.34 kg/m   Pertinent Labs, Studies, and Procedures:  CBC Latest Ref Rng & Units 03/14/2019 12/26/2018 01/08/2018  WBC 4.0 - 10.5 K/uL 9.8 10.6(H) 10.0  Hemoglobin 12.0 - 15.0 g/dL 13.6 14.5 13.7  Hematocrit 36.0 - 46.0 % 40.1 40.8 40.8  Platelets 150 - 400 K/uL 305 317 318   BMP Latest Ref Rng & Units 03/14/2019 12/26/2018 01/08/2018  Glucose 70 - 99 mg/dL 77 103(H) 77  BUN 6 - 20 mg/dL 11 14 11   Creatinine 0.44 - 1.00 mg/dL 0.59 0.57 0.60  Sodium 135 - 145 mmol/L 139 136 141  Potassium 3.5 - 5.1 mmol/L 3.8 4.0 4.0  Chloride 98 - 111 mmol/L 109 105 110  CO2 22 - 32 mmol/L 21(L) 21(L) 23  Calcium 8.9 - 10.3 mg/dL 3.6(U) 9.4 8.9   EEG  Patient history: 19 year old female presented with episodes of head bobbing concerning for seizure activity.  EEG to evaluate for seizures.   Level of alertness: awake, asleep   AEDs during EEG study: LTG   Technical aspects: This EEG study was done with scalp electrodes positioned according to the 10-20 International system of electrode placement. Electrical activity  was acquired at a sampling rate of 500Hz  and reviewed with a high frequency filter of 70Hz  and a low frequency filter of 1Hz . EEG data were recorded continuously and digitally stored.    DECSRIPTION: The posterior dominant rhythm consists of 8 Hz activity of moderate voltage (25-35 uV) seen predominantly in posterior head regions, symmetric and reactive to eye opening and eye closing.  Sleep was characterized by vertex waves, sleep spindles (12 to 14 Hz), maximal frontocentral.  Physiologic photic driving was not seen during photic stimulation.  No EEG change was seen during hyperventilation.   2 events of head-bobbing were recorded during this study.  Concomitant EEG before during and after the episodes did not show any EEG changes suggest seizures.   IMPRESSION: This study is within normal limits. No seizures or epileptiform discharges were seen throughout the recording.   2 episodes of head bobbing recorded during the study without any EEG change.  These were nonepileptic events.  Discharge Instructions: Discharge Instructions     Call MD for:  difficulty breathing, headache or visual disturbances   Complete by: As directed    Call MD for:  hives   Complete by: As directed    Call MD for:  persistant dizziness or light-headedness   Complete by: As directed    Call MD for:  persistant nausea and vomiting   Complete by: As directed    Call MD for:  severe uncontrolled pain   Complete by: As directed    Diet - low sodium heart healthy   Complete by: As directed    Increase activity slowly   Complete by: As directed        Signed: , MD 04/06/2019, 1:53 PM   Pager: [phone]

## 2019-12-07 NOTE — Progress Notes (Signed)
Your procedure is scheduled on Friday September 17.  Report to [facility] Main Entrance "A" at 07:00 A.M., and check in at the Admitting office.  Call this number if you have problems the morning of surgery: [phone]  Call [phone] if you have any questions prior to your surgery date Monday-Friday 8am-4pm   Remember: Do not eat or drink after midnight the night before your surgery   Take these medicines the morning of surgery with A SIP OF WATER: carbamazepine (TEGRETOL XR)  gabapentin (NEURONTIN) sertraline (ZOLOFT)   As of today, STOP taking any Aspirin (unless otherwise instructed by your surgeon), Aleve, Naproxen, Ibuprofen, Motrin, Advil, Goody's, BC's, all herbal medications, fish oil, and all vitamins.    The Morning of Surgery  Do not wear jewelry, make-up or nail polish.  Do not wear lotions, powders, or perfumes, or deodorant  Do not shave 48 hours prior to surgery.    Do not bring valuables to the hospital.  [facility] is not responsible for any belongings or valuables.  If you are a smoker, DO NOT Smoke 24 hours prior to surgery  If you wear a CPAP at night please bring your mask the morning of surgery   Remember that you must have someone to transport you home after your surgery, and remain with you for 24 hours if you are discharged the same day.   Please bring cases for contacts, glasses, hearing aids, dentures or bridgework because it cannot be worn into surgery.    Leave your suitcase in the car.  After surgery it may be brought to your room.  For patients admitted to the hospital, discharge time will be determined by your treatment team.  Patients discharged the day of surgery will not be allowed to drive home.    Special instructions:   [place]- Preparing For Surgery  Before surgery, you can play an important role. Because skin is not sterile, your skin needs to be as free of germs as possible. You can reduce the number of germs on  your skin by washing with CHG (chlorahexidine gluconate) Soap before surgery.  CHG is an antiseptic cleaner which kills germs and bonds with the skin to continue killing germs even after washing.    Oral Hygiene is also important to reduce your risk of infection.  Remember - BRUSH YOUR TEETH THE MORNING OF SURGERY WITH YOUR REGULAR TOOTHPASTE  Please do not use if you have an allergy to CHG or antibacterial soaps. If your skin becomes reddened/irritated stop using the CHG.  Do not shave (including legs and underarms) for at least 48 hours prior to first CHG shower. It is OK to shave your face.  Please follow these instructions carefully.   1. Shower the NIGHT BEFORE SURGERY and the MORNING OF SURGERY with CHG Soap.   2. If you chose to wash your hair and body, wash as usual with your normal shampoo and body-wash/soap.  3. Rinse your hair and body thoroughly to remove the shampoo and soap.  4. Apply CHG directly to the skin (ONLY FROM THE NECK DOWN) and wash gently with a scrungie or a clean washcloth.   5. Do not use on open wounds or open sores. Avoid contact with your eyes, ears, mouth and genitals (private parts). Wash Face and genitals (private parts)  with your normal soap.   6. Wash thoroughly, paying special attention to the area where your surgery will be performed.  7. Thoroughly rinse your body with warm  water from the neck down.  8. DO NOT shower/wash with your normal soap after using and rinsing off the CHG Soap.  9. Pat yourself dry with a CLEAN TOWEL.  10. Wear CLEAN PAJAMAS to bed the night before surgery  11. Place CLEAN SHEETS on your bed the night of your first shower and DO NOT SLEEP WITH PETS.  12. Wear comfortable clothes the morning of surgery.     Day of Surgery:  Please shower the morning of surgery with the CHG soap Do not apply any deodorants/lotions. Please wear clean clothes to the hospital/surgery center.   Remember to brush your teeth WITH YOUR  REGULAR TOOTHPASTE.   Please read over the following fact sheets that you were given.

## 2019-12-08 ENCOUNTER — Encounter (HOSPITAL_COMMUNITY)
Admission: RE | Admit: 2019-12-08 | Discharge: 2019-12-08 | Disposition: A | Discharge status: Home / Self Care | Payer: Medicaid Other | Source: Ambulatory Visit | Attending: Oral Surgery | Admitting: Oral Surgery

## 2019-12-08 ENCOUNTER — Other Ambulatory Visit: Payer: Self-pay

## 2019-12-08 ENCOUNTER — Encounter (HOSPITAL_COMMUNITY): Payer: Self-pay

## 2019-12-08 ENCOUNTER — Other Ambulatory Visit (HOSPITAL_COMMUNITY)
Admission: RE | Admit: 2019-12-08 | Discharge: 2019-12-08 | Disposition: A | Discharge status: Home / Self Care | Payer: Medicaid Other | Source: Ambulatory Visit | Attending: Oral Surgery | Admitting: Oral Surgery

## 2019-12-08 DIAGNOSIS — Z20822 Contact with and (suspected) exposure to covid-19: Secondary | ICD-10-CM | POA: Insufficient documentation

## 2019-12-08 DIAGNOSIS — Z01812 Encounter for preprocedural laboratory examination: Secondary | ICD-10-CM | POA: Insufficient documentation

## 2019-12-08 LAB — CBC
HCT: 43 % (ref 36.0–46.0)
Hemoglobin: 14.9 g/dL (ref 12.0–15.0)
MCH: 30.8 pg (ref 26.0–34.0)
MCHC: 34.7 g/dL (ref 30.0–36.0)
MCV: 88.8 fL (ref 80.0–100.0)
Platelets: 329 10*3/uL (ref 150–400)
RBC: 4.84 MIL/uL (ref 3.87–5.11)
RDW: 12.8 % (ref 11.5–15.5)
WBC: 8.5 10*3/uL (ref 4.0–10.5)
nRBC: 0 % (ref 0.0–0.2)

## 2019-12-08 LAB — SARS CORONAVIRUS 2 (TAT 6-24 HRS): SARS Coronavirus 2: NEGATIVE

## 2019-12-08 NOTE — Progress Notes (Signed)
PCP - Dr. Sol Passer  Cardiologist - pt denies   Chest x-ray - n/a EKG - n/a Stress Test - pt denies ECHO - pt denies Cardiac Cath - pt denies   Blood Thinner Instructions: n/a Aspirin Instructions: n/a   COVID TEST- 12/08/19  Coronavirus Screening  Have you experienced the following symptoms:  Cough yes/no: No Fever (>100.74F)  yes/no: No Runny nose yes/no: No Sore throat yes/no: No Difficulty breathing/shortness of breath  yes/no: No  Have you or a family member traveled in the last 14 days and where? yes/no: No   If the patient indicates "YES" to the above questions, their PAT will be rescheduled to limit the exposure to others and, the surgeon will be notified. THE PATIENT WILL NEED TO BE ASYMPTOMATIC FOR 14 DAYS.   If the patient is not experiencing any of these symptoms, the PAT nurse will instruct them to NOT bring anyone with them to their appointment since they may have these symptoms or traveled as well.   Please remind your patients and families that hospital visitation restrictions are in effect and the importance of the restrictions.     Anesthesia review: n/a  Patient denies shortness of breath, fever, cough and chest pain at PAT appointment   All instructions explained to the patient, with a verbal understanding of the material. Patient agrees to go over the instructions while at home for a better understanding. Patient also instructed to self quarantine after being tested for COVID-19. The opportunity to ask questions was provided.

## 2019-12-10 MED ORDER — DEXTROSE 5 % IV SOLN
3.0000 g | INTRAVENOUS | Status: AC
  Administered 2019-12-11: 3 g via INTRAVENOUS
  Filled 2019-12-10: qty 3

## 2019-12-10 NOTE — Anesthesia Preprocedure Evaluation (Addendum)
Anesthesia Evaluation  Patient identified by MRN, date of birth, ID band Patient awake    Reviewed: Allergy & Precautions, H&P , NPO status , Patient's Chart, lab work & pertinent test results  Airway Mallampati: III  TM Distance: >3 FB Neck ROM: Full    Dental no notable dental hx. (+) Teeth Intact, Dental Advisory Given   Pulmonary asthma ,    Pulmonary exam normal breath sounds clear to auscultation       Cardiovascular Exercise Tolerance: Good negative cardio ROS   Rhythm:Regular Rate:Normal     Neuro/Psych Depression Bipolar Disorder negative neurological ROS  negative psych ROS   GI/Hepatic negative GI ROS, Neg liver ROS,   Endo/Other  Morbid obesity  Renal/GU negative Renal ROS  negative genitourinary   Musculoskeletal   Abdominal   Peds  Hematology negative hematology ROS (+)   Anesthesia Other Findings   Reproductive/Obstetrics negative OB ROS                            Anesthesia Physical Anesthesia Plan  ASA: III  Anesthesia Plan: General   Post-op Pain Management:    Induction: Intravenous  PONV Risk Score and Plan: 4 or greater and Ondansetron, Dexamethasone and Midazolam  Airway Management Planned: Nasal ETT and Video Laryngoscope Planned  Additional Equipment:   Intra-op Plan:   Post-operative Plan: Extubation in OR  Informed Consent: I have reviewed the patients History and Physical, chart, labs and discussed the procedure including the risks, benefits and alternatives for the proposed anesthesia with the patient or authorized representative who has indicated his/her understanding and acceptance.     Dental advisory given  Plan Discussed with: CRNA  Anesthesia Plan Comments:        Anesthesia Quick Evaluation

## 2019-12-11 ENCOUNTER — Other Ambulatory Visit: Payer: Self-pay

## 2019-12-11 ENCOUNTER — Ambulatory Visit (HOSPITAL_COMMUNITY): Payer: Medicaid Other | Admitting: Certified Registered Nurse Anesthetist

## 2019-12-11 ENCOUNTER — Encounter (HOSPITAL_COMMUNITY): Payer: Self-pay | Admitting: Oral Surgery

## 2019-12-11 ENCOUNTER — Ambulatory Visit (HOSPITAL_COMMUNITY): Payer: Medicaid Other | Admitting: Vascular Surgery

## 2019-12-11 ENCOUNTER — Encounter (HOSPITAL_COMMUNITY)
Admission: RE | Disposition: A | Discharge status: Home / Self Care | Payer: Self-pay | Source: Ambulatory Visit | Attending: Oral Surgery

## 2019-12-11 ENCOUNTER — Ambulatory Visit (HOSPITAL_COMMUNITY)
Admission: RE | Admit: 2019-12-11 | Discharge: 2019-12-11 | Disposition: A | Discharge status: Home / Self Care | Payer: Medicaid Other | Source: Ambulatory Visit | Attending: Oral Surgery | Admitting: Oral Surgery

## 2019-12-11 DIAGNOSIS — K011 Impacted teeth: Secondary | ICD-10-CM | POA: Insufficient documentation

## 2019-12-11 DIAGNOSIS — Z6841 Body Mass Index (BMI) 40.0 and over, adult: Secondary | ICD-10-CM | POA: Diagnosis not present

## 2019-12-11 HISTORY — PX: TOOTH EXTRACTION: SHX859

## 2019-12-11 LAB — POCT PREGNANCY, URINE: Preg Test, Ur: NEGATIVE

## 2019-12-11 SURGERY — DENTAL RESTORATION/EXTRACTIONS
Anesthesia: General | Site: Mouth

## 2019-12-11 MED ORDER — HYDROMORPHONE HCL 1 MG/ML IJ SOLN
INTRAMUSCULAR | Status: AC
  Filled 2019-12-11: qty 1

## 2019-12-11 MED ORDER — ROCURONIUM BROMIDE 10 MG/ML (PF) SYRINGE
PREFILLED_SYRINGE | INTRAVENOUS | Status: DC | PRN
  Administered 2019-12-11: 50 mg via INTRAVENOUS

## 2019-12-11 MED ORDER — ACETAMINOPHEN 500 MG PO TABS
1000.0000 mg | ORAL_TABLET | Freq: Once | ORAL | Status: AC
  Administered 2019-12-11: 1000 mg via ORAL
  Filled 2019-12-11: qty 2

## 2019-12-11 MED ORDER — DEXMEDETOMIDINE (PRECEDEX) IN NS 20 MCG/5ML (4 MCG/ML) IV SYRINGE
PREFILLED_SYRINGE | INTRAVENOUS | Status: DC | PRN
  Administered 2019-12-11: 8 ug via INTRAVENOUS

## 2019-12-11 MED ORDER — LIDOCAINE-EPINEPHRINE 2 %-1:100000 IJ SOLN
INTRAMUSCULAR | Status: DC | PRN
  Administered 2019-12-11: 10 mL

## 2019-12-11 MED ORDER — FENTANYL CITRATE (PF) 250 MCG/5ML IJ SOLN
INTRAMUSCULAR | Status: AC
  Filled 2019-12-11: qty 5

## 2019-12-11 MED ORDER — FENTANYL CITRATE (PF) 100 MCG/2ML IJ SOLN
INTRAMUSCULAR | Status: DC | PRN
Start: 2019-12-11 — End: 2019-12-11
  Administered 2019-12-11: 100 ug via INTRAVENOUS

## 2019-12-11 MED ORDER — PROPOFOL 10 MG/ML IV BOLUS
INTRAVENOUS | Status: AC
  Filled 2019-12-11: qty 20

## 2019-12-11 MED ORDER — SODIUM CHLORIDE 0.9 % IR SOLN
Status: DC | PRN
  Administered 2019-12-11: 1000 mL

## 2019-12-11 MED ORDER — PROPOFOL 10 MG/ML IV BOLUS
INTRAVENOUS | Status: DC | PRN
  Administered 2019-12-11: 200 mg via INTRAVENOUS

## 2019-12-11 MED ORDER — DEXAMETHASONE SODIUM PHOSPHATE 10 MG/ML IJ SOLN
INTRAMUSCULAR | Status: AC
  Filled 2019-12-11: qty 1

## 2019-12-11 MED ORDER — 0.9 % SODIUM CHLORIDE (POUR BTL) OPTIME
TOPICAL | Status: DC | PRN
  Administered 2019-12-11: 1000 mL

## 2019-12-11 MED ORDER — HYDROMORPHONE HCL 1 MG/ML IJ SOLN
0.2500 mg | INTRAMUSCULAR | Status: DC | PRN
  Administered 2019-12-11: 0.5 mg via INTRAVENOUS

## 2019-12-11 MED ORDER — HYDROCODONE-ACETAMINOPHEN 5-325 MG PO TABS
1.0000 | ORAL_TABLET | Freq: Four times a day (QID) | ORAL | 0 refills | Status: AC | PRN
Start: 2019-12-11 — End: ?

## 2019-12-11 MED ORDER — ROCURONIUM BROMIDE 10 MG/ML (PF) SYRINGE
PREFILLED_SYRINGE | INTRAVENOUS | Status: AC
  Filled 2019-12-11: qty 30

## 2019-12-11 MED ORDER — ORAL CARE MOUTH RINSE: 15.0000 mL | Freq: Once | OROMUCOSAL | Status: AC

## 2019-12-11 MED ORDER — ONDANSETRON HCL 4 MG/2ML IJ SOLN
INTRAMUSCULAR | Status: DC | PRN
  Administered 2019-12-11: 4 mg via INTRAVENOUS

## 2019-12-11 MED ORDER — OXYMETAZOLINE HCL 0.05 % NA SOLN
NASAL | Status: AC
  Filled 2019-12-11: qty 30

## 2019-12-11 MED ORDER — LIDOCAINE 2% (20 MG/ML) 5 ML SYRINGE
INTRAMUSCULAR | Status: DC | PRN
  Administered 2019-12-11: 40 mg via INTRAVENOUS

## 2019-12-11 MED ORDER — LACTATED RINGERS IV SOLN: INTRAVENOUS | Status: DC

## 2019-12-11 MED ORDER — MIDAZOLAM HCL 2 MG/2ML IJ SOLN
INTRAMUSCULAR | Status: DC | PRN
  Administered 2019-12-11: 2 mg via INTRAVENOUS

## 2019-12-11 MED ORDER — AMOXICILLIN 500 MG PO CAPS: 500.0000 mg | ORAL_CAPSULE | Freq: Three times a day (TID) | ORAL | 0 refills | Status: AC

## 2019-12-11 MED ORDER — SUGAMMADEX SODIUM 200 MG/2ML IV SOLN
INTRAVENOUS | Status: DC | PRN
  Administered 2019-12-11: 260 mg via INTRAVENOUS

## 2019-12-11 MED ORDER — CHLORHEXIDINE GLUCONATE 0.12 % MT SOLN
15.0000 mL | Freq: Once | OROMUCOSAL | Status: AC
  Administered 2019-12-11: 15 mL via OROMUCOSAL
  Filled 2019-12-11: qty 15

## 2019-12-11 MED ORDER — DEXMEDETOMIDINE (PRECEDEX) IN NS 20 MCG/5ML (4 MCG/ML) IV SYRINGE
PREFILLED_SYRINGE | INTRAVENOUS | Status: AC
  Filled 2019-12-11: qty 5

## 2019-12-11 MED ORDER — DEXAMETHASONE SODIUM PHOSPHATE 10 MG/ML IJ SOLN
INTRAMUSCULAR | Status: DC | PRN
  Administered 2019-12-11: 10 mg via INTRAVENOUS

## 2019-12-11 MED ORDER — MIDAZOLAM HCL 2 MG/2ML IJ SOLN
INTRAMUSCULAR | Status: AC
  Filled 2019-12-11: qty 2

## 2019-12-11 MED ORDER — DEXMEDETOMIDINE (PRECEDEX) IN NS 20 MCG/5ML (4 MCG/ML) IV SYRINGE
PREFILLED_SYRINGE | INTRAVENOUS | Status: AC
  Filled 2019-12-11: qty 10

## 2019-12-11 MED ORDER — LIDOCAINE-EPINEPHRINE 2 %-1:100000 IJ SOLN
INTRAMUSCULAR | Status: AC
  Filled 2019-12-11: qty 1

## 2019-12-11 MED ORDER — LIDOCAINE 2% (20 MG/ML) 5 ML SYRINGE
INTRAMUSCULAR | Status: AC
  Filled 2019-12-11: qty 15

## 2019-12-11 SURGICAL SUPPLY — 29 items
BLADE SURG 15 STRL LF DISP TIS (BLADE) ×1 IMPLANT
BLADE SURG 15 STRL SS (BLADE) ×3
BUR CROSS CUT FISSURE 1.6 (BURR) ×2 IMPLANT
BUR CROSS CUT FISSURE 1.6MM (BURR) ×1
BUR EGG ELITE 4.0 (BURR) IMPLANT
BUR EGG ELITE 4.0MM (BURR)
CANISTER SUCT 3000ML PPV (MISCELLANEOUS) ×3 IMPLANT
COVER SURGICAL LIGHT HANDLE (MISCELLANEOUS) ×3 IMPLANT
DECANTER SPIKE VIAL GLASS SM (MISCELLANEOUS) ×3 IMPLANT
DRAPE U-SHAPE 76X120 STRL (DRAPES) IMPLANT
GAUZE PACKING FOLDED 2  STR (GAUZE/BANDAGES/DRESSINGS) ×3
GAUZE PACKING FOLDED 2 STR (GAUZE/BANDAGES/DRESSINGS) ×1 IMPLANT
GLOVE BIO SURGEON STRL SZ8 (GLOVE) ×3 IMPLANT
GOWN STRL REUS W/ TWL LRG LVL3 (GOWN DISPOSABLE) ×1 IMPLANT
GOWN STRL REUS W/ TWL XL LVL3 (GOWN DISPOSABLE) ×1 IMPLANT
GOWN STRL REUS W/TWL LRG LVL3 (GOWN DISPOSABLE) ×3
GOWN STRL REUS W/TWL XL LVL3 (GOWN DISPOSABLE) ×3
IV NS 1000ML (IV SOLUTION) ×3
IV NS 1000ML BAXH (IV SOLUTION) ×1 IMPLANT
KIT BASIN OR (CUSTOM PROCEDURE TRAY) ×3 IMPLANT
KIT TURNOVER KIT B (KITS) ×3 IMPLANT
NEEDLE HYPO 25GX1X1/2 BEV (NEEDLE) ×3 IMPLANT
NS IRRIG 1000ML POUR BTL (IV SOLUTION) ×3 IMPLANT
PAD ARMBOARD 7.5X6 YLW CONV (MISCELLANEOUS) ×3 IMPLANT
SLEEVE IRRIGATION ELITE 7 (MISCELLANEOUS) ×3 IMPLANT
SUT CHROMIC 3 0 PS 2 (SUTURE) ×6 IMPLANT
TRAY ENT MC OR (CUSTOM PROCEDURE TRAY) ×3 IMPLANT
TUBING IRRIGATION (MISCELLANEOUS) ×3 IMPLANT
YANKAUER SUCT BULB TIP NO VENT (SUCTIONS) ×3 IMPLANT

## 2019-12-11 NOTE — Transfer of Care (Signed)
Immediate Anesthesia Transfer of Care Note  Patient: Deborah Hubbard  Procedure(s) Performed: DENTAL EXTRACTIONS OF TEETH NUMBERS ONE, SIXTEEN, SEVENTEEN, AND THRITY-TWO (N/A Mouth)  Patient Location: PACU  Anesthesia Type:General  Level of Consciousness: drowsy and patient cooperative  Airway & Oxygen Therapy: Patient Spontanous Breathing and Patient connected to nasal cannula oxygen  Post-op Assessment: Report given to RN and Post -op Vital signs reviewed and stable  Post vital signs: Reviewed and stable  Last Vitals:  Vitals Value Taken Time  BP 120/72 12/11/19 1055  Temp 36.1 C 12/11/19 1055  Pulse 99 12/11/19 1107  Resp 25 12/11/19 1107  SpO2 100 % 12/11/19 1107  Vitals shown include unvalidated device data.  Last Pain:  Vitals:   12/11/19 1055  TempSrc:   PainSc: Asleep      Patients Stated Pain Goal: 2 (12/11/19 0659)  Complications: No complications documented.

## 2019-12-11 NOTE — Brief Op Note (Signed)
12/11/2019  10:40 AM  PATIENT:  Deborah Hubbard  19 y.o. female  PRE-OPERATIVE DIAGNOSIS:  IMPACTED TEETH 1, 16, 17, 32  POST-OPERATIVE DIAGNOSIS:  SAME  PROCEDURE:  Procedure(s): DENTAL EXTRACTIONS OF TEETH NUMBERS ONE, SIXTEEN, SEVENTEEN, AND THRITY-TWO (N/A)  SURGEON:  Surgeon(s) and Role:    * Ocie Doyne, DDS - Primary    ASSISTANTS: none   ANESTHESIA:   general  EBL:  MINIMAL   BLOOD ADMINISTERED:none  DRAINS: none   LOCAL MEDICATIONS USED:  LIDOCAINE   SPECIMEN:  No Specimen  DISPOSITION OF SPECIMEN:  N/A  COUNTS:  YES  TOURNIQUET:  * No tourniquets in log *  DICTATION: .Other Dictation: Dictation Number [id number]  PLAN OF CARE: Discharge to home after PACU  PATIENT DISPOSITION:  PACU - hemodynamically stable.   Delay start of Pharmacological VTE agent (>24hrs) due to surgical blood loss or risk of bleeding: not applicable

## 2019-12-11 NOTE — Anesthesia Postprocedure Evaluation (Signed)
Anesthesia Post Note  Patient: Deborah Hubbard  Procedure(s) Performed: DENTAL EXTRACTIONS OF TEETH NUMBERS ONE, SIXTEEN, SEVENTEEN, AND THRITY-TWO (N/A Mouth)     Patient location during evaluation: PACU Anesthesia Type: General Level of consciousness: awake and alert Pain management: pain level controlled Vital Signs Assessment: post-procedure vital signs reviewed and stable Respiratory status: spontaneous breathing, nonlabored ventilation and respiratory function stable Cardiovascular status: blood pressure returned to baseline and stable Postop Assessment: no apparent nausea or vomiting Anesthetic complications: no   No complications documented.  Last Vitals:  Vitals:   12/11/19 1125 12/11/19 1140  BP: (!) 148/88 (!) 141/82  Pulse: 98 99  Resp: (!) 31 (!) 25  Temp:  36.6 C  SpO2: 98% 95%    Last Pain:  Vitals:   12/11/19 1140  TempSrc:   PainSc: 4                  Lamira Borin,W. EDMOND

## 2019-12-11 NOTE — H&P (Signed)
HISTORY AND PHYSICAL  Deborah Hubbard is a 19 y.o. female patient with CC: Painful wisdom teeth  No diagnosis found.  Past Medical History:  Diagnosis Date  . Asthma   . Bipolar disorder (HCC)   . Depression     Current Facility-Administered Medications  Medication Dose Route Frequency Provider Last Rate Last Admin  . ceFAZolin (ANCEF) 3 g in dextrose 5 % 50 mL IVPB  3 g Intravenous On Call to OR Ocie Doyne, DDS      . lactated ringers infusion   Intravenous Continuous Marcene Duos, MD 10 mL/hr at 12/11/19 0712 New Bag at 12/11/19 8676   No Known Allergies Active Problems:   * No active hospital problems. *  Vitals: Blood pressure 135/82, pulse 95, temperature 97.9 F (36.6 C), temperature source Temporal, resp. rate 18, height 4\' 8"  (1.422 m), weight 132.3 kg, last menstrual period 11/18/2019, SpO2 98 %. Lab results: Results for orders placed or performed during the hospital encounter of 12/11/19 (from the past 24 hour(s))  Pregnancy, urine POC     Status: None   Collection Time: 12/11/19  6:58 AM  Result Value Ref Range   Preg Test, Ur NEGATIVE NEGATIVE   Radiology Results: No results found. General appearance: alert, cooperative and morbidly obese Head: Normocephalic, without obvious abnormality, atraumatic Eyes: negative Nose: Nares normal. Septum midline. Mucosa normal. No drainage or sinus tenderness. Throat: lips, mucosa, and tongue normal; teeth and gums normal and impacted teeth #1, 16, 17, 32 Neck: no adenopathy and supple, symmetrical, trachea midline Resp: clear to auscultation bilaterally Cardio: regular rate and rhythm, S1, S2 normal, no murmur, click, rub or gallop  Assessment:19 yo morbidly obese F with impacted teeth 1, 16, 17, 32  Plan: Extraction teeth 1, 16, 17, 32. GA. Day surgery.   12/13/19 12/11/2019

## 2019-12-11 NOTE — Op Note (Signed)
NAME: Deborah Hubbard, Deborah Hubbard. MEDICAL RECORD [id number] ACCOUNT [id number] DATE OF BIRTH:2001-02-10 FACILITY: [facility] LOCATION: [facility] PHYSICIAN:Lilyth Lawyer M. Markee Matera, DDS  OPERATIVE REPORT  DATE OF PROCEDURE:  12/11/2019  PREOPERATIVE DIAGNOSES:  1.  Impacted teeth numbers 1, 16, 17 and 32. 2.  Morbid obesity.  POSTOPERATIVE DIAGNOSES:   1.  Impacted teeth numbers 1, 16, 17 and 32. 2.  Morbid obesity.  PROCEDURE:  Extraction of teeth numbers 1, 16, 17 and 32.  SURGEON:  Ocie Doyne, DDS  ANESTHESIA:  General, attending was Dr. Sampson Goon, nasal intubation.  DESCRIPTION PROCEDURE:  The patient was taken to the operating room and placed on the table in supine position.  General anesthesia was administered intravenously and a nasal endotracheal tube was placed and secured.  The eyes were protected and the  patient was draped for surgery.  A timeout was performed.  The posterior pharynx was suctioned and a throat pack was placed.  Two percent lidocaine, 1:100,000 epinephrine was infiltrated in an inferior alveolar block and in buccal and palatal  infiltration in the maxilla.  A bite block was placed on the right side of the mouth and a sweetheart retractor was used to retract the tongue.  A #15 blade used to make an incision overlying tooth #17 and then carried forward in the buccal sulcus to  tooth #18 mesial and then overlying tooth #16 ,carried forward to tooth #13 distal in the buccal sulcus.  The periosteum was reflected from around these teeth.  The left lower was operated first.  The Stryker handpiece under irrigation with a fissure bur  was used to remove bone overlying the tooth and the tooth was sectioned and removed with a 301 elevator.  Then in the maxilla, bone was removed overlying tooth #16.  The buccal fat pad protruded from the flap area and had to be trimmed using scissors.   Then, the tooth was elevated and removed with a Neurosurgeon.  The sockets were curetted,  irrigated and then closure with 3-0 chromic.  The bite block and sweetheart retractor were repositioned to the other side of the mouth.  The 15 blade was used to  make an incision overlying tooth #1, carried forward in the buccal sulcus to tooth #4 mesial.  Then the 15 was used to incise over tooth #17 and carried forward to tooth #18 mesial in the buccal sulcus.  The periosteum was reflected over to expose the  bone.  Then, bone was removed overlying tooth #32 and the bone was removed overlying tooth #1.  Tooth #32 was elevated and then during attempted extraction with the elevator, the tooth fractured.  Additional bone was removed and the socket was examined.   No root tip was noted.  It was presumed that the root tip went into the suction, so this area was irrigated and closed with 3-0 chromic after thoroughly searching and then #16 was removed by using the Potts elevator and 301 elevator to elevate the  tooth.  The socket was curetted, irrigated, and closed with 3-0 chromic.  Then, the oral cavity was irrigated and suctioned.  The throat pack was removed.  The patient was left in care of anesthesia for discharge home through day surgery.  ESTIMATED BLOOD LOSS:  Minimal.  COMPLICATIONS:  None.  SPECIMENS:  None.  VN/NUANCE  D:12/11/2019 T:12/11/2019 JOB:[id number]

## 2019-12-12 ENCOUNTER — Encounter (HOSPITAL_COMMUNITY): Payer: Self-pay | Admitting: Oral Surgery

## 2020-07-12 IMAGING — MR MR HEAD WO/W CM
16 of 18 series · 42 of 48 positions shown · IV contrast (gadavist)
Comparison: Head CT 03/14/2019

CLINICAL DATA: Seizure-like activity.

EXAM:
MRI HEAD WITHOUT AND WITH CONTRAST
TECHNIQUE: Multiplanar, multiecho pulse sequences of the brain and surrounding
structures were obtained without and with intravenous contrast.
CONTRAST:  10mL GADAVIST GADOBUTROL 1 MMOL/ML IV SOLN

[Series 5: DWI · axial · 3.0mm · 0.88mm/px · z∈[-134,+15]mm · 7 of 104 slices shown (1 of 4)]
[im 1/104]
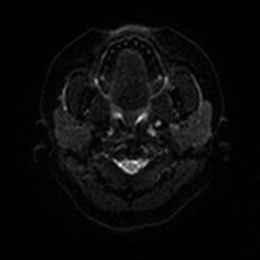
[im 18/104]
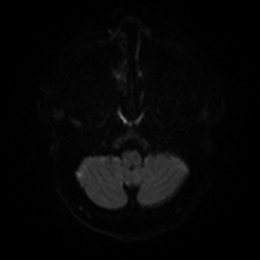
[im 35/104]
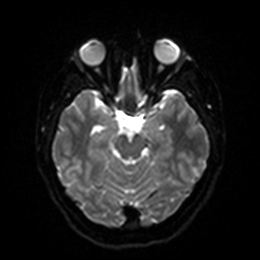
[im 52/104]
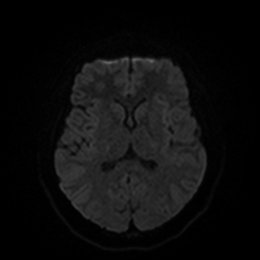
[im 69/104]
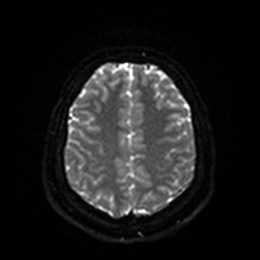
[im 86/104]
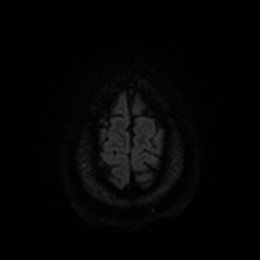
[im 104/104]
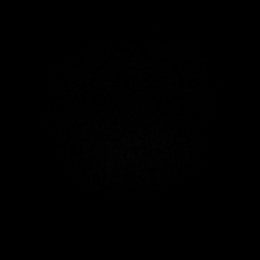

[Series 6: DWI · axial · 3.0mm · 0.88mm/px · z∈[-134,+12]mm · 4 of 51 slices shown (2 of 4)]
[im 1/51]
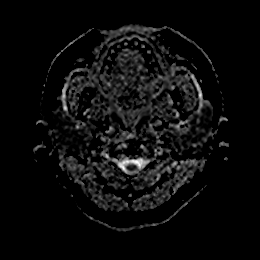
[im 17/51]
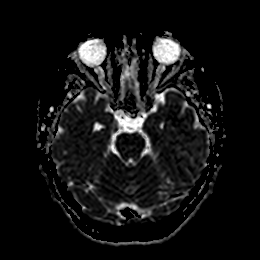
[im 34/51]
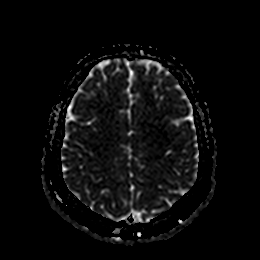
[im 51/51]
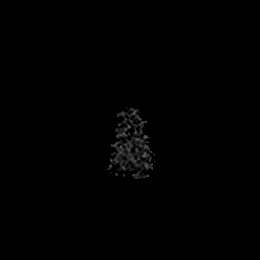

[Series 7: DWI · coronal · 4.0mm · 0.88mm/px · 5 of 68 slices shown (3 of 4)]
[im 1/68]
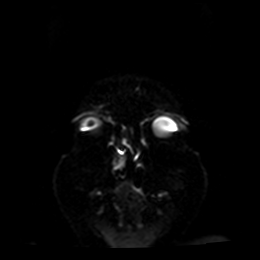
[im 17/68]
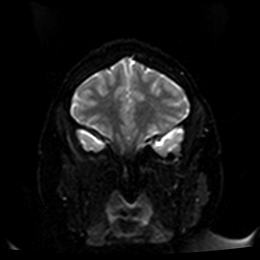
[im 34/68]
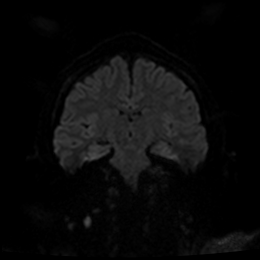
[im 51/68]
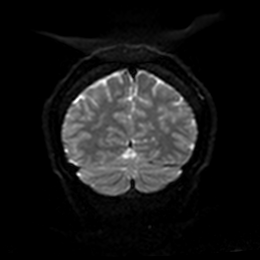
[im 68/68]
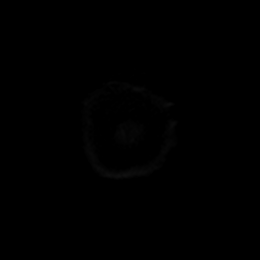

[Series 8: DWI · coronal · 4.0mm · 0.88mm/px · 2 of 34 slices shown (4 of 4)]
[im 1/34]
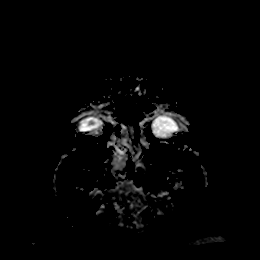
[im 34/34]
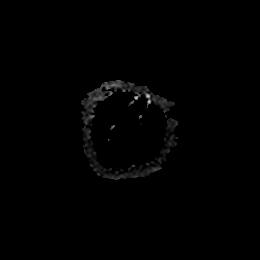

[Series 9: T1 · sagittal · 5.0mm · 0.75mm/px · 1 of 25 slices shown]
[im 1/25]
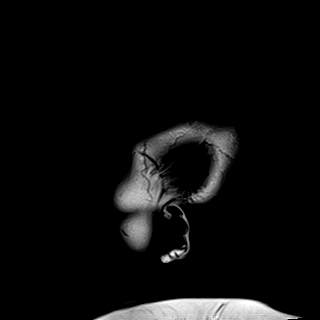

[Series 10: FLAIR · axial · 5.0mm · 0.45mm/px · 1 of 25 slices shown (1 of 2)]
[im 1/25]
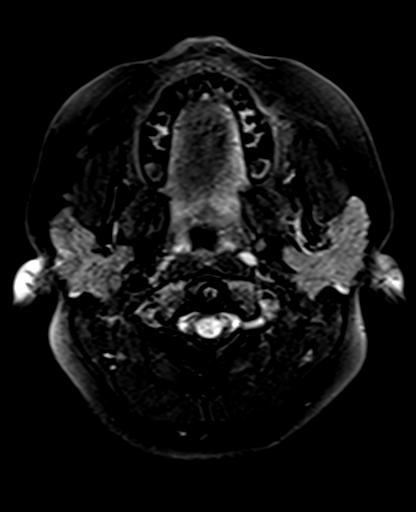

[Series 11: mag_images · axial · 3.0mm · 0.90mm/px · z∈[-141,+19]mm · 3 of 56 slices shown]
[im 1/56]
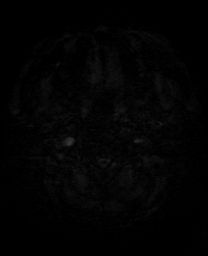
[im 28/56]
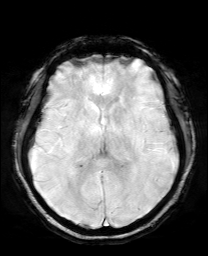
[im 56/56]
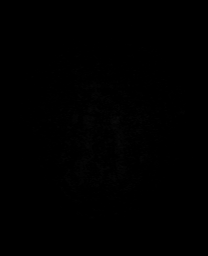

[Series 12: pha_images · axial · 3.0mm · 0.90mm/px · z∈[-141,+16]mm · 3 of 55 slices shown]
[im 1/55]
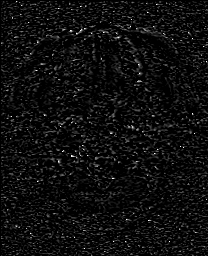
[im 28/55]
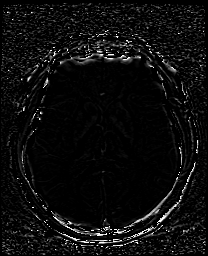
[im 55/55]
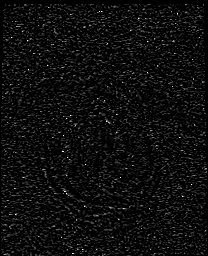

[Series 13: swi_images · axial · 3.0mm · 0.90mm/px · z∈[-141,+19]mm · 3 of 56 slices shown]
[im 1/56]
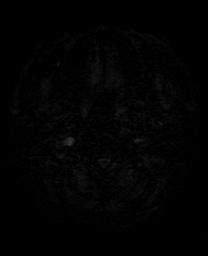
[im 28/56]
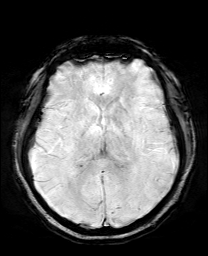
[im 56/56]
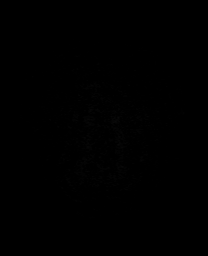

[Series 14: mip_images(sw) · axial · 24.0mm · 0.90mm/px · z∈[-131,+9]mm · 3 of 49 slices shown]
[im 1/49]
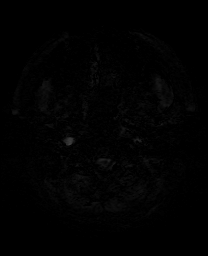
[im 25/49]
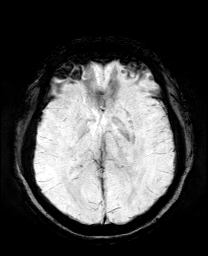
[im 49/49]
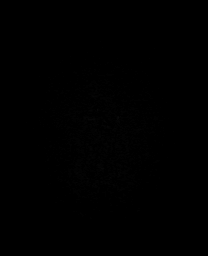

[Series 15: T2 · axial · 5.0mm · 0.72mm/px · 1 of 25 slices shown (1 of 2)]
[im 1/25]
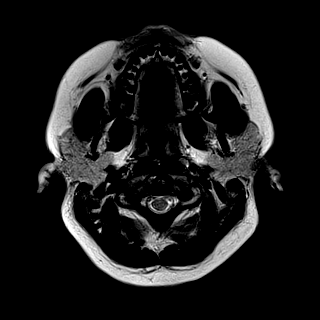

[Series 16: T2 · oblique · 3.0mm · 0.27mm/px · 2 of 34 slices shown (2 of 2)]
[im 1/34]
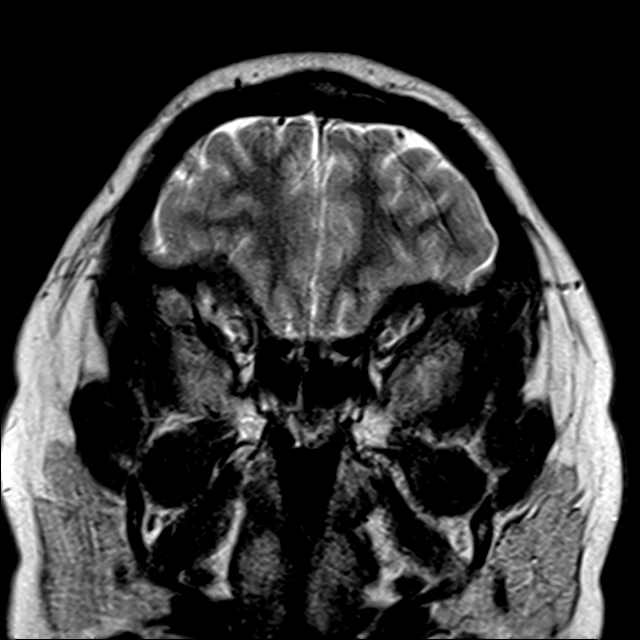
[im 34/34]
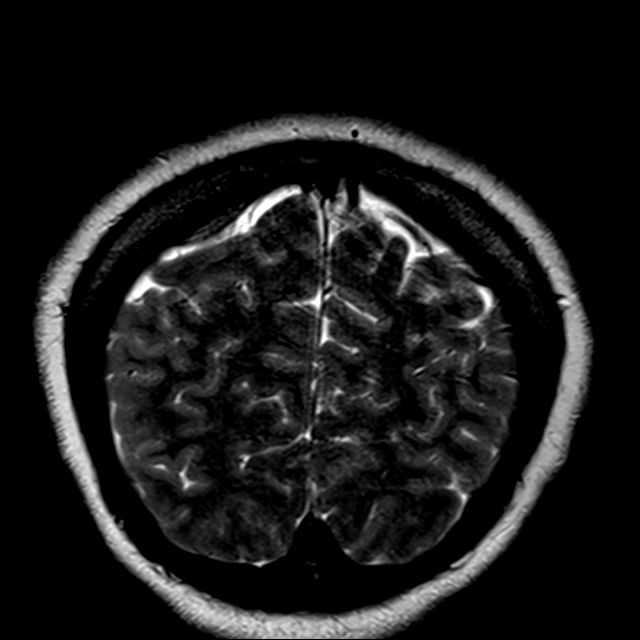

[Series 17: FLAIR · oblique · 3.0mm · 0.56mm/px · 2 of 34 slices shown (2 of 2)]
[im 1/34]
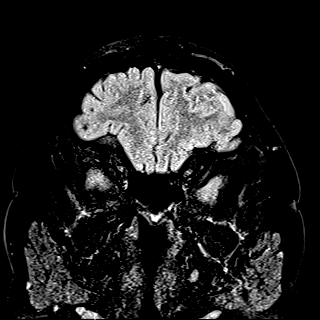
[im 34/34]
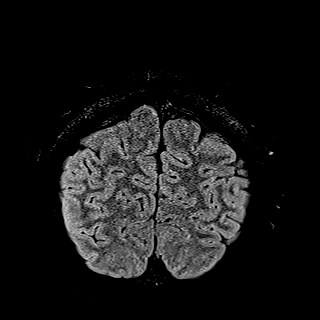

[Series 21: T1 post-contrast · coronal · 5.0mm · 0.34mm/px · 2 of 28 slices shown (1 of 2)]
[im 1/28]
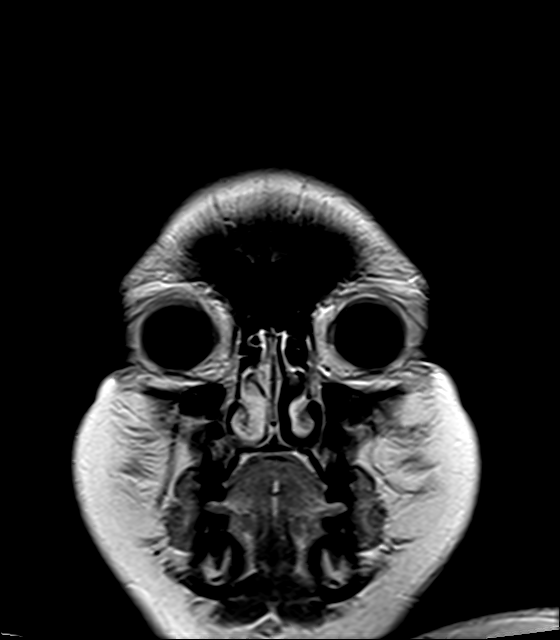
[im 28/28]
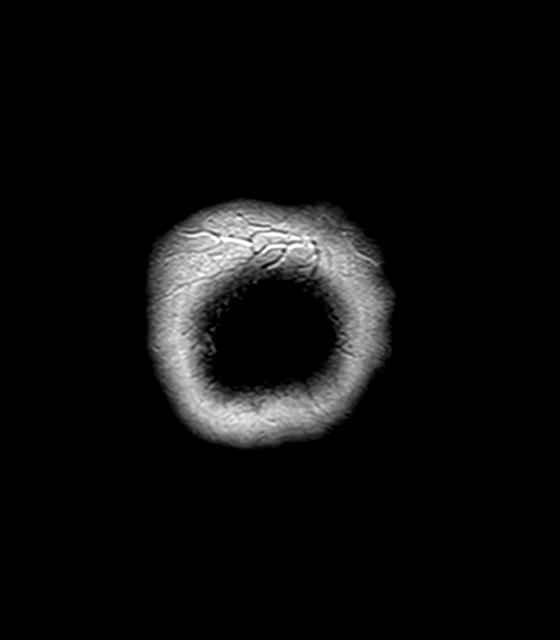

[Series 22: T1 post-contrast · sagittal · 5.0mm · 0.72mm/px · 1 of 25 slices shown (2 of 2)]
[im 1/25]
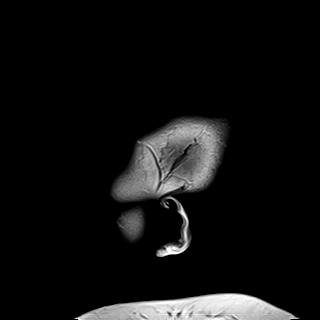

[Series 23: T2 post-contrast · coronal · 5.0mm · 0.72mm/px · 2 of 28 slices shown]
[im 1/28]
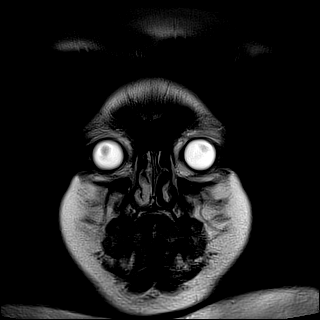
[im 28/28]
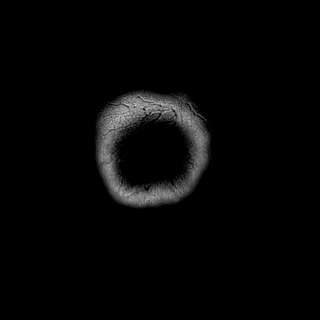

[42 of 48 positions shown; findings below may reference images not displayed]

FINDINGS: Brain: There is no evidence of acute infarct, intracranial
hemorrhage, mass, midline shift, or extra-axial fluid collection.
The ventricles and sulci are normal. The brain is normal in signal.
No abnormal enhancement is identified.

Dedicated thin section imaging through the temporal lobes is mildly
motion degraded but demonstrates symmetric volume and signal of the
hippocampi. There is no evidence of heterotopia or cortical
dysplasia.

Vascular: Major intracranial vascular flow voids are preserved.

Skull and upper cervical spine: Unremarkable bone marrow signal.

Sinuses/Orbits: Unremarkable orbits. Minimal bilateral sphenoid
sinus mucosal thickening. Clear mastoid air cells.

Other: None.
IMPRESSION: Unremarkable appearance of the brain.
